# Patient Record
Sex: Male | Born: 1960 | Race: Black or African American | Hispanic: No | Marital: Single | Smoking: Former smoker
Health system: Southern US, Community
[De-identification: ages and names within clinical notes are randomized; demographics above are authoritative.]

## PROBLEM LIST (undated history)

## (undated) DIAGNOSIS — Z8249 Family history of ischemic heart disease and other diseases of the circulatory system: Secondary | ICD-10-CM

## (undated) DIAGNOSIS — I2510 Atherosclerotic heart disease of native coronary artery without angina pectoris: Secondary | ICD-10-CM

## (undated) DIAGNOSIS — R0789 Other chest pain: Secondary | ICD-10-CM

## (undated) DIAGNOSIS — I10 Essential (primary) hypertension: Secondary | ICD-10-CM

## (undated) DIAGNOSIS — E785 Hyperlipidemia, unspecified: Secondary | ICD-10-CM

## (undated) HISTORY — DX: Other chest pain: R07.89

## (undated) HISTORY — PX: TONSILLECTOMY: SUR1361

## (undated) HISTORY — DX: Atherosclerotic heart disease of native coronary artery without angina pectoris: I25.10

## (undated) HISTORY — DX: Family history of ischemic heart disease and other diseases of the circulatory system: Z82.49

## (undated) HISTORY — PX: CORONARY ANGIOPLASTY WITH STENT PLACEMENT: SHX49

## (undated) HISTORY — DX: Essential (primary) hypertension: I10

## (undated) HISTORY — PX: PERIPHERAL VASCULAR INTERVENTION: CATH118257

---

## 2017-06-02 ENCOUNTER — Emergency Department (HOSPITAL_COMMUNITY)
Admission: EM | Admit: 2017-06-02 | Discharge: 2017-06-02 | Disposition: A | Discharge status: Home / Self Care | Payer: 59 | Attending: Emergency Medicine | Admitting: Emergency Medicine

## 2017-06-02 ENCOUNTER — Encounter (HOSPITAL_COMMUNITY): Payer: Self-pay | Admitting: Emergency Medicine

## 2017-06-02 ENCOUNTER — Emergency Department (HOSPITAL_COMMUNITY): Payer: 59

## 2017-06-02 DIAGNOSIS — I10 Essential (primary) hypertension: Secondary | ICD-10-CM | POA: Diagnosis not present

## 2017-06-02 DIAGNOSIS — Z7982 Long term (current) use of aspirin: Secondary | ICD-10-CM | POA: Diagnosis not present

## 2017-06-02 DIAGNOSIS — R072 Precordial pain: Secondary | ICD-10-CM

## 2017-06-02 DIAGNOSIS — Z87891 Personal history of nicotine dependence: Secondary | ICD-10-CM | POA: Diagnosis not present

## 2017-06-02 DIAGNOSIS — Z79899 Other long term (current) drug therapy: Secondary | ICD-10-CM | POA: Insufficient documentation

## 2017-06-02 DIAGNOSIS — Z91040 Latex allergy status: Secondary | ICD-10-CM | POA: Diagnosis not present

## 2017-06-02 DIAGNOSIS — R079 Chest pain, unspecified: Secondary | ICD-10-CM

## 2017-06-02 HISTORY — DX: Hyperlipidemia, unspecified: E78.5

## 2017-06-02 HISTORY — DX: Essential (primary) hypertension: I10

## 2017-06-02 LAB — BASIC METABOLIC PANEL
Anion gap: 11 (ref 5–15)
BUN: 22 mg/dL — AB (ref 6–20)
CHLORIDE: 98 mmol/L — AB (ref 101–111)
CO2: 27 mmol/L (ref 22–32)
Calcium: 9.1 mg/dL (ref 8.9–10.3)
Creatinine, Ser: 1.55 mg/dL — ABNORMAL HIGH (ref 0.61–1.24)
GFR calc Af Amer: 55 mL/min — ABNORMAL LOW (ref >60)
GFR calc non Af Amer: 48 mL/min — ABNORMAL LOW (ref >60)
Glucose, Bld: 125 mg/dL — ABNORMAL HIGH (ref 65–99)
POTASSIUM: 2.9 mmol/L — AB (ref 3.5–5.1)
SODIUM: 136 mmol/L (ref 135–145)

## 2017-06-02 LAB — CBC
HEMATOCRIT: 40.2 % (ref 39.0–52.0)
Hemoglobin: 13.3 g/dL (ref 13.0–17.0)
MCH: 26.9 pg (ref 26.0–34.0)
MCHC: 33.1 g/dL (ref 30.0–36.0)
MCV: 81.4 fL (ref 78.0–100.0)
PLATELETS: 231 10*3/uL (ref 150–400)
RBC: 4.94 MIL/uL (ref 4.22–5.81)
RDW: 14.2 % (ref 11.5–15.5)
WBC: 9.7 10*3/uL (ref 4.0–10.5)

## 2017-06-02 LAB — I-STAT TROPONIN, ED
TROPONIN I, POC: 0 ng/mL (ref 0.00–0.08)
Troponin i, poc: 0 ng/mL (ref 0.00–0.08)

## 2017-06-02 MED ORDER — ASPIRIN 81 MG PO CHEW
324.0000 mg | CHEWABLE_TABLET | Freq: Once | ORAL | Status: AC
  Administered 2017-06-02: 324 mg via ORAL
  Filled 2017-06-02: qty 4

## 2017-06-02 MED ORDER — POTASSIUM CHLORIDE CRYS ER 20 MEQ PO TBCR
60.0000 meq | EXTENDED_RELEASE_TABLET | Freq: Once | ORAL | Status: AC
  Administered 2017-06-02: 60 meq via ORAL
  Filled 2017-06-02: qty 3

## 2017-06-02 NOTE — ED Provider Notes (Signed)
[facility]-EMERGENCY DEPT Provider Note   CSN: [id number] Arrival date & time: 06/02/17  1827     History   Chief Complaint Chief Complaint  Patient presents with  . Chest Pain    HPI Shaun [name] is a 56 y.o. male.  HPI Pt comes in with cc of chest pain. Pain started 2 days ago, is intermittent and lasts for few minutes. Pt has no specific evoking factor. Pain is described as dull pain, and is non radiating. Pain is worse with deep inspiration and improves with walking. Pt also feels like he has indigestion. Pt has no associated nausea, dib, sweating.  Pt has family hx of CAD. Pt is not a smoker, and he denies substance abuse.  Pt had a stress test several years ago, which was neg,  Past Medical History:  Diagnosis Date  . Hyperlipidemia with target low density lipoprotein (LDL) cholesterol less than 100 mg/dL   . Hypertension     There are no active problems to display for this patient.   History reviewed. No pertinent surgical history.     Home Medications    Prior to Admission medications   Medication Sig Start Date End Date Taking? Authorizing Provider  aspirin EC 81 MG tablet Take 81 mg by mouth daily.   Yes [provider]  cilostazol (PLETAL) 100 MG tablet Take 100 mg by mouth 2 (two) times daily. 01/08/17 01/08/18 Yes [provider]  enalapril-hydrochlorothiazide (VASERETIC) 10-25 MG tablet Take 1 tablet by mouth daily. 01/08/17  Yes [provider]  metoprolol succinate (TOPROL-XL) 50 MG 24 hr tablet Take 50 mg by mouth daily.   Yes [provider]  Multiple Vitamin (MULTI-VITAMINS) TABS Take 1 tablet by mouth daily.   Yes [provider]  NIFEdipine (PROCARDIA-XL/ADALAT CC) 60 MG 24 hr tablet Take 60 mg by mouth daily.   Yes [provider]  potassium chloride (K-DUR) 10 MEQ tablet Take 20 mEq by mouth daily.   Yes [provider]  simvastatin (ZOCOR) 20 MG tablet Take 20 mg by mouth daily.   Yes  [provider]  timolol (TIMOPTIC) 0.5 % ophthalmic solution Place 1 drop into both eyes 2 (two) times daily. 04/03/17  Yes [provider]    Family History No family history on file.  Social History Social History  Substance Use Topics  . Smoking status: Former Games developer  . Smokeless tobacco: Former Neurosurgeon  . Alcohol use Yes     Allergies   Latex   Review of Systems Review of Systems  Constitutional: Positive for activity change.  Respiratory: Positive for chest tightness.   Cardiovascular: Positive for chest pain.  Gastrointestinal: Positive for abdominal pain. Negative for nausea.  Neurological: Negative for light-headedness.  Hematological: Does not bruise/bleed easily.  All other systems reviewed and are negative.    Physical Exam Updated Vital Signs BP (!) 151/90   Pulse 93   Temp 98.9 F (37.2 C) (Oral)   Resp (!) 23   Ht  (1.803 m)   Wt 93 kg (205 lb)   SpO2 99%   BMI 28.59 kg/m   Physical Exam  Constitutional: He is oriented to person, place, and time. He appears well-developed.  HENT:  Head: Atraumatic.  Neck: Neck supple.  Cardiovascular: Normal rate.   Pulmonary/Chest: Effort normal.  Neurological: He is alert and oriented to person, place, and time.  Skin: Skin is warm.  Nursing note and vitals reviewed.    ED Treatments / Results  Labs (all labs ordered are listed, but only abnormal results are displayed) Labs Reviewed  BASIC METABOLIC PANEL - Abnormal; Notable for the following:       Result Value   Potassium 2.9 (*)    Chloride 98 (*)    Glucose, Bld 125 (*)    BUN 22 (*)    Creatinine, Ser 1.55 (*)    GFR calc non Af Amer 48 (*)    GFR calc Af Amer 55 (*)    All other components within normal limits  CBC  I-STAT TROPONIN, ED  I-STAT TROPONIN, ED    EKG  EKG Interpretation  Date/Time:  Saturday June 02 2017 18:33:39 EDT Ventricular Rate:  123 PR Interval:  148 QRS Duration: 84 QT  Interval:  318 QTC Calculation: 455 R Axis:   56 Text Interpretation:  Sinus tachycardia Otherwise normal ECG No acute changes No significant change since last tracing Confirmed by Derwood Kaplan [phone]) on 06/02/2017 8:43:09 PM       Radiology Dg Chest 2 View  Result Date: 06/02/2017 CLINICAL DATA:  Left-sided chest pain for 3 days. EXAM: CHEST  2 VIEW COMPARISON:  None FINDINGS: The heart size and mediastinal contours are within normal limits. Left lung base atelectasis identified. The visualized skeletal structures are unremarkable. IMPRESSION: 1. Left base atelectasis. Electronically Signed   By: Signa Kell M.D.   On: 06/02/2017 19:26    Procedures Procedures (including critical care time)  Medications Ordered in ED Medications  aspirin chewable tablet 324 mg (324 mg Oral Given 06/02/17 2220)  potassium chloride SA (K-DUR,KLOR-CON) CR tablet 60 mEq (60 mEq Oral Given 06/02/17 2220)     Initial Impression / Assessment and Plan / ED Course  I have reviewed the triage vital signs and the nursing notes.  Pertinent labs & imaging results that were available during my care of the patient were reviewed by me and considered in my medical decision making (see chart for details).  Clinical Course as of Jun 03 2  Sat Jun 02, 2017  2256 Results from the ER workup discussed with the patient face to face and all questions answered to the best of my ability.  Pt informed that his HEART score is 4, therefore there is a MACE possibility of 10% over the next month. We recommended admission to the hospital, but pt preferred an outpatient approach. We discussed the risk and benefits of going home, including possible morbidity and mortality risk, and pt comfortable going home. He will return to the ER if there is worsening chest pain, shortness of breath, pain radiating to your jaw, shoulder, or back, sweats or fainting.  Troponin i, poc: 0.00 [AN]    Clinical Course User Index [AN]  Derwood Kaplan, MD   Differential diagnosis includes: ACS syndrome Valvular disorder Myocarditis Pericarditis Endocarditis PE Pneumothorax Musculoskeletal pain PUD / Gastritis / Esophagitis Esophageal spasm  Pt comes in with cc of chest pain. Chest pain is not typical for ACS, it is intermittent, unprovoked, better with ambulation. Pain is L sided however and pt has hx of PVD, HTN, HL and even premature CAD in the family. EKG is reassuring. We will reassess.  Final Clinical Impressions(s) / ED Diagnoses   Final diagnoses:  Precordial pain  Nonspecific chest pain    New Prescriptions New Prescriptions   No medications on file     Derwood Kaplan, MD 06/03/17 0004

## 2017-06-02 NOTE — ED Notes (Signed)
Pt understood dc material. NAD noted. 

## 2017-06-02 NOTE — Discharge Instructions (Signed)
We saw you in the ER for the chest pain. All of our cardiac workup is normal, including labs, EKG and chest X-RAY are normal. We are not sure what is causing your discomfort, we are still concerned for possibly heart being the cause. The workup in the ER is not complete, and you should follow up with the Cardiologist for further evaluation.  Please return to the ER if you have worsening chest pain, shortness of breath, pain radiating to your jaw, shoulder, or back, sweats or fainting. Otherwise see the Cardiologist as requested.

## 2017-06-02 NOTE — ED Triage Notes (Signed)
Pt. Stated, I've had chest pain since Thursday and its not gone away. Denies any other symptoms

## 2017-06-05 ENCOUNTER — Encounter: Payer: Self-pay | Admitting: Cardiovascular Disease

## 2017-06-05 ENCOUNTER — Ambulatory Visit (INDEPENDENT_AMBULATORY_CARE_PROVIDER_SITE_OTHER): Payer: Managed Care, Other (non HMO) | Admitting: Cardiovascular Disease

## 2017-06-05 ENCOUNTER — Telehealth: Payer: Self-pay | Admitting: *Deleted

## 2017-06-05 VITALS — BP 134/80 | HR 98 | Ht 71.0 in | Wt 213.6 lb

## 2017-06-05 DIAGNOSIS — Z8249 Family history of ischemic heart disease and other diseases of the circulatory system: Secondary | ICD-10-CM | POA: Diagnosis not present

## 2017-06-05 DIAGNOSIS — I10 Essential (primary) hypertension: Secondary | ICD-10-CM

## 2017-06-05 DIAGNOSIS — E7800 Pure hypercholesterolemia, unspecified: Secondary | ICD-10-CM | POA: Diagnosis not present

## 2017-06-05 DIAGNOSIS — R079 Chest pain, unspecified: Secondary | ICD-10-CM | POA: Diagnosis not present

## 2017-06-05 DIAGNOSIS — Z5181 Encounter for therapeutic drug level monitoring: Secondary | ICD-10-CM

## 2017-06-05 DIAGNOSIS — E785 Hyperlipidemia, unspecified: Secondary | ICD-10-CM | POA: Insufficient documentation

## 2017-06-05 DIAGNOSIS — R0789 Other chest pain: Secondary | ICD-10-CM

## 2017-06-05 DIAGNOSIS — E876 Hypokalemia: Secondary | ICD-10-CM

## 2017-06-05 HISTORY — DX: Essential (primary) hypertension: I10

## 2017-06-05 HISTORY — DX: Other chest pain: R07.89

## 2017-06-05 HISTORY — DX: Family history of ischemic heart disease and other diseases of the circulatory system: Z82.49

## 2017-06-05 NOTE — Progress Notes (Signed)
Cardiology Office Note   Date:  06/05/2017   ID:  Shaun [name], DOB [birth year], MRN [id number]  PCP:  Frederico Hamman, MD  Cardiologist:   Chilton Si, MD   Chief Complaint  Patient presents with  . New Patient (Initial Visit)      History of Present Illness: Shaun [name] is a 57 y.o. male with hypertension and hyperlipidemia who presents for an evaluation of chest pain.  He was seen in the ED 9/29 with two days of pleuritic chest pain that improved with walking.  He also reported indigestion.  EKG showed sinus tachycardia at 123 bpm and no ischemia.  Cardiac enzymes were negative x2.  He reports that prior to going to the ED he had 3 days of constant, left-sided chest pain.  The symptoms started at rest and improved with walking.  There was no associated shortness of breath, nausea or diaphoresis.  The Pain ranges from 1-4 in severity. The constant, dull discomfort. Two days ago he had a good bowel movement movement and the symptoms subsided.  He has not experienced any exertional symptoms.  He also notes that it is better after drinking alcohol.  He doesn't get much formal exercise.  [name] died of a heart attack at age 67.  [name] snoring and daytime somnolence but denies apnea or feeling tired upon awakening.  He sometimes checks his BP at home and it is typically 120/80.  He doesn't add salt to his food but eats out every day.  He quit smoking over 30 years ago.  He notes that his heart rate is chronically high but he denies palpitations.    Past Medical History:  Diagnosis Date  . Atypical chest pain 06/05/2017  . Essential hypertension 06/05/2017  . Family history of premature CAD 06/05/2017  . Hyperlipidemia with target low density lipoprotein (LDL) cholesterol less than 100 mg/dL   . Hypertension     No past surgical history on file.   Current Outpatient Prescriptions  Medication Sig Dispense Refill  . aspirin EC 81 MG tablet Take 81  mg by mouth daily.    . cilostazol (PLETAL) 100 MG tablet Take 100 mg by mouth 2 (two) times daily.    . enalapril-hydrochlorothiazide (VASERETIC) 10-25 MG tablet Take 1 tablet by mouth daily.    . metoprolol succinate (TOPROL-XL) 50 MG 24 hr tablet Take 50 mg by mouth daily.    . Multiple Vitamin (MULTI-VITAMINS) TABS Take 1 tablet by mouth daily.    Marland Kitchen NIFEdipine (PROCARDIA-XL/ADALAT CC) 60 MG 24 hr tablet Take 60 mg by mouth daily.    . potassium chloride (K-DUR) 10 MEQ tablet Take 20 mEq by mouth daily.    . simvastatin (ZOCOR) 20 MG tablet Take 20 mg by mouth daily.    . timolol (TIMOPTIC) 0.5 % ophthalmic solution Place 1 drop into both eyes 2 (two) times daily.  0   No current facility-administered medications for this visit.     Allergies:   Latex    Social History:  The patient  reports that he has quit smoking. He has quit using smokeless tobacco. He reports that he drinks alcohol. He reports that he does not use drugs.   Family History:  The patient's family history includes CAD in his father; Heart attack in his father, paternal grandmother, and paternal uncle; Stroke in his brother.    ROS:  Please see the history of present illness.   Otherwise, review of systems  are positive for none.   All other systems are reviewed and negative.    PHYSICAL EXAM: VS:  BP 134/80   Pulse 98   Ht  (1.803 m)   Wt 96.9 kg (213 lb 9.6 oz)   BMI 29.79 kg/m  , BMI Body mass index is 29.79 kg/m. GENERAL:  Well appearing HEENT:  Pupils equal round and reactive, fundi not visualized, oral mucosa unremarkable NECK:  No jugular venous distention, waveform within normal limits, carotid upstroke brisk and symmetric, no bruits, no thyromegaly LUNGS:  Clear to auscultation bilaterally HEART:  RRR.  PMI not displaced or sustained,S1 and S2 within normal limits, no S3, no S4, no clicks, no rubs, no murmurs ABD:  Flat, positive bowel sounds normal in frequency in pitch, no bruits, no rebound,  no guarding, no midline pulsatile mass, no hepatomegaly, no splenomegaly EXT:  2 plus pulses throughout, no edema, no cyanosis no clubbing SKIN:  No rashes no nodules NEURO:  Cranial nerves II through XII grossly intact, motor grossly intact throughout PSYCH:  Cognitively intact, oriented to person place and time    EKG:  EKG is not ordered today. 06/02/17: Sinus tachycardia.  Rate 123 bpm.   Recent Labs: 06/02/2017: BUN 22; Creatinine, Ser 1.55; Hemoglobin 13.3; Platelets 231; Potassium 2.9; Sodium 136   01/02/17: Total cholesterol 212, triglycerides 150, HDL 52, LDL 129   Lipid Panel No results found for: CHOL, TRIG, HDL, CHOLHDL, VLDL, LDLCALC, LDLDIRECT    Wt Readings from Last 3 Encounters:  06/05/17 96.9 kg (213 lb 9.6 oz)  06/02/17 93 kg (205 lb)      ASSESSMENT AND PLAN:  # Atypical chest pain: # Family history of premature CAD: # Hyperlipidemia: Symptoms Are unlikely to represent ischemia. However, given his father's history of premature CAD, we will get a coronary CT-A to better evaluate.  Continue aspirin, metoprolol, and simvastatin. His statin may need to be changed based on the findings of his coronary CT-A.  # Hypertension: Blood pressure is above goal today. However, he reports that it is well-controlled at home. I have asked him to log his blood pressures and bring to follow-up. Continue enalapril, hydrochlorothiazide, metoprolol, and nifedipine.    Current medicines are reviewed at length with the patient today.  The patient does not have concerns regarding medicines.  The following changes have been made:  no change  Labs/ tests ordered today include:   Orders Placed This Encounter  Procedures  . CT CORONARY MORPH W/CTA COR W/SCORE W/CA W/CM &/OR WO/CM  . CT CORONARY FRACTIONAL FLOW RESERVE DATA PREP  . CT CORONARY FRACTIONAL FLOW RESERVE FLUID ANALYSIS     Disposition:   FU with Daviyon Widmayer C. Duke Salvia, MD, [facility] in 6 weeks.    This note was written  with the assistance of speech recognition software.  Please excuse any transcriptional errors.  Signed, Avamarie Crossley C. Duke Salvia, MD, [facility]  06/05/2017 12:52 PM    [facility]

## 2017-06-05 NOTE — Patient Instructions (Signed)
Medication Instructions:  Your physician recommends that you continue on your current medications as directed. Please refer to the Current Medication list given to you today.  Labwork: none  Testing/Procedures: CORONARY (CARDIAC)  CTA   Follow-Up: Your physician recommends that you schedule a follow-up appointment in: 6 WEEKS   Any Other Special Instructions Will Be Listed Below (If Applicable). THE MORNING OF YOUR CARDIAC CTA TAKE METOPROLOL 100 MG   MONITOR YOUR BLOOD PRESSURE 3 TIMES A WEEK AND BRING TO YOUR FOLLOW UP     Cardiac CT Angiogram A cardiac CT angiogram is a procedure to look at the heart and the area around the heart. It may be done to help find the cause of chest pains or other symptoms of heart disease. During this procedure, a large X-ray machine, called a CT scanner, takes detailed pictures of the heart and the surrounding area after a dye (contrast material) has been injected into blood vessels in the area. The procedure is also sometimes called a coronary CT angiogram, coronary artery scanning, or CTA. A cardiac CT angiogram allows the health care provider to see how well blood is flowing to and from the heart. The health care provider will be able to see if there are any problems, such as:  Blockage or narrowing of the coronary arteries in the heart.  Fluid around the heart.  Signs of weakness or disease in the muscles, valves, and tissues of the heart.  Tell a health care provider about:  Any allergies you have. This is especially important if you have had a previous allergic reaction to contrast dye.  All medicines you are taking, including vitamins, herbs, eye drops, creams, and over-the-counter medicines.  Any blood disorders you have.  Any surgeries you have had.  Any medical conditions you have.  Whether you are pregnant or may be pregnant.  Any anxiety disorders, chronic pain, or other conditions you have that may increase your stress or prevent  you from lying still. What are the risks? Generally, this is a safe procedure. However, problems may occur, including:  Bleeding.  Infection.  Allergic reactions to medicines or dyes.  Damage to other structures or organs.  Kidney damage from the dye or contrast that is used.  Increased risk of cancer from radiation exposure. This risk is low. Talk with your health care provider about: ? The risks and benefits of testing. ? How you can receive the lowest dose of radiation.  What happens before the procedure?  Wear comfortable clothing and remove any jewelry, glasses, dentures, and hearing aids.  Follow instructions from your health care provider about eating and drinking. This may include: ? For 12 hours before the test - avoid caffeine. This includes tea, coffee, soda, energy drinks, and diet pills. Drink plenty of water or other fluids that do not have caffeine in them. Being well-hydrated can prevent complications. ? For 4-6 hours before the test - stop eating and drinking. The contrast dye can cause nausea, but this is less likely if your stomach is empty.  Ask your health care provider about changing or stopping your regular medicines. This is especially important if you are taking diabetes medicines, blood thinners, or medicines to treat erectile dysfunction. What happens during the procedure?  Hair on your chest may need to be removed so that small sticky patches called electrodes can be placed on your chest. These will transmit information that helps to monitor your heart during the test.  An IV tube will be  inserted into one of your veins.  You might be given a medicine to control your heart rate during the test. This will help to ensure that good images are obtained.  You will be asked to lie on an exam table. This table will slide in and out of the CT machine during the procedure.  Contrast dye will be injected into the IV tube. You might feel warm, or you may get a  metallic taste in your mouth.  You will be given a medicine (nitroglycerin) to relax (dilate) the arteries in your heart.  The table that you are lying on will move into the CT machine tunnel for the scan.  The person running the machine will give you instructions while the scans are being done. You may be asked to: ? Keep your arms above your head. ? Hold your breath. ? Stay very still, even if the table is moving.  When the scanning is complete, you will be moved out of the machine.  The IV tube will be removed. The procedure may vary among health care providers and hospitals. What happens after the procedure?  You might feel warm, or you may get a metallic taste in your mouth from the contrast dye.  You may have a headache from the nitroglycerin.  After the procedure, drink water or other fluids to wash (flush) the contrast material out of your body.  Contact a health care provider if you have any symptoms of allergy to the contrast. These symptoms include: ? Shortness of breath. ? Rash or hives. ? A racing heartbeat.  Most people can return to their normal activities right after the procedure. Ask your health care provider what activities are safe for you.  It is up to you to get the results of your procedure. Ask your health care provider, or the department that is doing the procedure, when your results will be ready. Summary  A cardiac CT angiogram is a procedure to look at the heart and the area around the heart. It may be done to help find the cause of chest pains or other symptoms of heart disease.  During this procedure, a large X-ray machine, called a CT scanner, takes detailed pictures of the heart and the surrounding area after a dye (contrast material) has been injected into blood vessels in the area.  Ask your health care provider about changing or stopping your regular medicines before the procedure. This is especially important if you are taking diabetes  medicines, blood thinners, or medicines to treat erectile dysfunction.  After the procedure, drink water or other fluids to wash (flush) the contrast material out of your body. This information is not intended to replace advice given to you by your health care provider. Make sure you discuss any questions you have with your health care provider. Document Released: 08/03/2008 Document Revised: 07/10/2016 Document Reviewed: 07/10/2016 Elsevier Interactive Patient Education  2017 [employer].

## 2017-06-05 NOTE — Telephone Encounter (Signed)
Patient seen in office today for consult. Dr Duke Salvia reviewed chart further after patient left and potassium was 2.9 at recent ED visit. Will increase K+ from 20 meq daily to 40 meq daily and repeat bmet end of week. Advised patient, verbalized understanding.

## 2017-06-08 LAB — BASIC METABOLIC PANEL
BUN/Creatinine Ratio: 11 (ref 9–20)
BUN: 16 mg/dL (ref 6–24)
CALCIUM: 9.2 mg/dL (ref 8.7–10.2)
CHLORIDE: 100 mmol/L (ref 96–106)
CO2: 24 mmol/L (ref 20–29)
CREATININE: 1.41 mg/dL — AB (ref 0.76–1.27)
GFR calc Af Amer: 63 mL/min/{1.73_m2} (ref >59)
GFR calc non Af Amer: 55 mL/min/{1.73_m2} — ABNORMAL LOW (ref >59)
GLUCOSE: 106 mg/dL — AB (ref 65–99)
Potassium: 3.7 mmol/L (ref 3.5–5.2)
SODIUM: 139 mmol/L (ref 134–144)

## 2017-06-11 ENCOUNTER — Telehealth: Payer: Self-pay | Admitting: *Deleted

## 2017-06-11 NOTE — Telephone Encounter (Signed)
-----   Message from Chilton Si, MD sent at 06/10/2017  1:32 PM EDT ----- Potassium is much better.  Kidney function is slightly better.

## 2017-06-11 NOTE — Telephone Encounter (Signed)
Advised patient of lab results Patient stated he did receive a call from [facility] and Coronary CT scan approved PA # [id number] Advised patient would let scheduling know approved and he should hear from them soon.

## 2017-06-20 ENCOUNTER — Encounter: Payer: Self-pay | Admitting: Cardiovascular Disease

## 2017-07-10 ENCOUNTER — Ambulatory Visit (HOSPITAL_COMMUNITY): Admission: RE | Admit: 2017-07-10 | Payer: Managed Care, Other (non HMO) | Source: Ambulatory Visit

## 2017-07-10 ENCOUNTER — Ambulatory Visit (HOSPITAL_COMMUNITY)
Admission: RE | Admit: 2017-07-10 | Discharge: 2017-07-10 | Disposition: A | Discharge status: Home / Self Care | Payer: Managed Care, Other (non HMO) | Source: Ambulatory Visit | Attending: Cardiovascular Disease | Admitting: Cardiovascular Disease

## 2017-07-10 DIAGNOSIS — I2510 Atherosclerotic heart disease of native coronary artery without angina pectoris: Secondary | ICD-10-CM | POA: Diagnosis not present

## 2017-07-10 DIAGNOSIS — R079 Chest pain, unspecified: Secondary | ICD-10-CM | POA: Diagnosis not present

## 2017-07-10 DIAGNOSIS — Z8249 Family history of ischemic heart disease and other diseases of the circulatory system: Secondary | ICD-10-CM | POA: Insufficient documentation

## 2017-07-10 MED ORDER — METOPROLOL TARTRATE 5 MG/5ML IV SOLN
INTRAVENOUS | Status: AC
  Administered 2017-07-10: 5 mg via INTRAVENOUS
  Filled 2017-07-10: qty 5

## 2017-07-10 MED ORDER — METOPROLOL TARTRATE 5 MG/5ML IV SOLN
5.0000 mg | INTRAVENOUS | Status: DC | PRN
  Administered 2017-07-10 (×5): 5 mg via INTRAVENOUS
  Filled 2017-07-10: qty 5

## 2017-07-10 MED ORDER — NITROGLYCERIN 0.4 MG SL SUBL
0.4000 mg | SUBLINGUAL_TABLET | SUBLINGUAL | Status: DC | PRN
  Administered 2017-07-10: 0.4 mg via SUBLINGUAL

## 2017-07-10 MED ORDER — IOPAMIDOL (ISOVUE-370) INJECTION 76%
INTRAVENOUS | Status: AC
  Administered 2017-07-10: 80 mL via INTRAVENOUS
  Filled 2017-07-10: qty 100

## 2017-07-10 MED ORDER — NITROGLYCERIN 0.4 MG SL SUBL
SUBLINGUAL_TABLET | SUBLINGUAL | Status: AC
  Administered 2017-07-10: 0.4 mg via SUBLINGUAL
  Filled 2017-07-10: qty 1

## 2017-07-10 MED ORDER — METOPROLOL TARTRATE 5 MG/5ML IV SOLN
INTRAVENOUS | Status: AC
  Filled 2017-07-10: qty 5

## 2017-07-10 MED ORDER — METOPROLOL TARTRATE 5 MG/5ML IV SOLN
INTRAVENOUS | Status: AC
  Administered 2017-07-10: 5 mg via INTRAVENOUS
  Filled 2017-07-10: qty 15

## 2017-07-11 ENCOUNTER — Other Ambulatory Visit: Payer: Self-pay | Admitting: Cardiology

## 2017-07-11 ENCOUNTER — Ambulatory Visit (HOSPITAL_COMMUNITY)
Admission: RE | Admit: 2017-07-11 | Discharge: 2017-07-11 | Disposition: A | Discharge status: Home / Self Care | Payer: Managed Care, Other (non HMO) | Source: Ambulatory Visit | Attending: Cardiovascular Disease | Admitting: Cardiovascular Disease

## 2017-07-11 DIAGNOSIS — Z8249 Family history of ischemic heart disease and other diseases of the circulatory system: Secondary | ICD-10-CM | POA: Diagnosis not present

## 2017-07-13 DIAGNOSIS — R079 Chest pain, unspecified: Secondary | ICD-10-CM | POA: Diagnosis not present

## 2017-07-19 ENCOUNTER — Telehealth: Payer: Self-pay | Admitting: *Deleted

## 2017-07-19 ENCOUNTER — Ambulatory Visit: Payer: Managed Care, Other (non HMO) | Admitting: Cardiovascular Disease

## 2017-07-19 ENCOUNTER — Encounter: Payer: Self-pay | Admitting: *Deleted

## 2017-07-19 ENCOUNTER — Encounter: Payer: Self-pay | Admitting: Cardiovascular Disease

## 2017-07-19 VITALS — BP 132/88 | HR 84 | Ht 71.0 in | Wt 214.4 lb

## 2017-07-19 DIAGNOSIS — Z9861 Coronary angioplasty status: Secondary | ICD-10-CM

## 2017-07-19 DIAGNOSIS — R0789 Other chest pain: Secondary | ICD-10-CM | POA: Diagnosis not present

## 2017-07-19 DIAGNOSIS — E7800 Pure hypercholesterolemia, unspecified: Secondary | ICD-10-CM | POA: Diagnosis not present

## 2017-07-19 DIAGNOSIS — Z8249 Family history of ischemic heart disease and other diseases of the circulatory system: Secondary | ICD-10-CM

## 2017-07-19 DIAGNOSIS — I2510 Atherosclerotic heart disease of native coronary artery without angina pectoris: Secondary | ICD-10-CM | POA: Diagnosis not present

## 2017-07-19 HISTORY — DX: Atherosclerotic heart disease of native coronary artery without angina pectoris: I25.10

## 2017-07-19 MED ORDER — ROSUVASTATIN CALCIUM 40 MG PO TABS: 40.0000 mg | ORAL_TABLET | Freq: Every day | ORAL | 3 refills | Status: DC

## 2017-07-19 MED ORDER — METOPROLOL SUCCINATE ER 100 MG PO TB24: 100.0000 mg | ORAL_TABLET | Freq: Every day | ORAL | 3 refills | Status: DC

## 2017-07-19 MED ORDER — POTASSIUM CHLORIDE ER 10 MEQ PO TBCR: 20.0000 meq | EXTENDED_RELEASE_TABLET | Freq: Two times a day (BID) | ORAL | 3 refills | Status: DC

## 2017-07-19 NOTE — Progress Notes (Signed)
Cardiology Office Note   Date:  07/19/2017   ID:  Shaun [name], DOB 08-Aug-1961, MRN [id number]  PCP:  Frederico HammanMiller, Carlton D, MD  Cardiologist:   Shaun Siiffany Inez, MD   No chief complaint on file.     History of Present Illness: Shaun [name] is a 56 y.o. male with CAD, hypertension and hyperlipidemia who presents for an evaluation of chest pain.  He was seen in the ED 9/29 with two days of pleuritic chest pain that improved with walking.  He also reported indigestion.  EKG showed sinus tachycardia at 123 bpm and no ischemia.  Cardiac enzymes were negative x2.  He reports that prior to going to the ED he had 3 days of constant, left-sided chest pain.  The symptoms started at rest and improved with walking.  There was no associated shortness of breath, nausea or diaphoresis.  He was seen in clinic 06/2017 and referred for a coronary CT-A that revealed 40-50% distal left main disease.  FFR of the mid LAD was 0.71 and the distal left circumflex was 0.75, both consistent with ischemia.  His coronary calcium score was in the 98th percentile.   Shaun [name]'s father died of a heart attack at age 58.  At his last appointment Shaun [name]'s blood pressure was noted to be elevated.  He was asked to check his blood pressure at home.  He kept a log of his BP but didn't bring it with him.  He thinks that most of his blood pressures have been pretty good.  He may have had 1 or 2 in the 140s.  He has been walking for exercise for 5 times per week.  He tends to walk for 25-30 minutes.  He has no exertional chest pain or shortness of breath.  He has not noted any lower extremity edema, orthopnea, or PND.  Past Medical History:  Diagnosis Date  . Atypical chest pain 06/05/2017  . CAD in native artery 07/19/2017  . Essential hypertension 06/05/2017  . Family history of premature CAD 06/05/2017  . Hyperlipidemia with target low density lipoprotein (LDL) cholesterol less than 100 mg/dL   . Hypertension     History  reviewed. No pertinent surgical history.   Current Outpatient Medications  Medication Sig Dispense Refill  . rosuvastatin (CRESTOR) 40 MG tablet Take 40 mg daily by mouth.    Marland Kitchen. aspirin EC 81 MG tablet Take 81 mg by mouth daily.    . cilostazol (PLETAL) 100 MG tablet Take 100 mg by mouth 2 (two) times daily.    . enalapril-hydrochlorothiazide (VASERETIC) 10-25 MG tablet Take 1 tablet by mouth daily.    . metoprolol succinate (TOPROL-XL) 50 MG 24 hr tablet Take 50 mg by mouth daily.    . Multiple Vitamin (MULTI-VITAMINS) TABS Take 1 tablet by mouth daily.    Marland Kitchen. NIFEdipine (PROCARDIA-XL/ADALAT CC) 60 MG 24 hr tablet Take 60 mg by mouth daily.    . potassium chloride (K-DUR) 10 MEQ tablet Take 20 mEq by mouth 2 (two) times daily.    . timolol (TIMOPTIC) 0.5 % ophthalmic solution Place 1 drop into both eyes 2 (two) times daily.  0   No current facility-administered medications for this visit.     Allergies:   Latex    Social History:  The patient  reports that he has quit smoking. He has quit using smokeless tobacco. He reports that he drinks alcohol. He reports that he does not use drugs.   Family History:  The  patient's family history includes CAD in his father; Heart attack in his father, paternal grandmother, and paternal uncle; Stroke in his brother.    ROS:  Please see the history of present illness.   Otherwise, review of systems are positive for none.   All other systems are reviewed and negative.    PHYSICAL EXAM: VS:  BP 132/88   Pulse 84   Ht 5\' 11"  (1.803 m)   Wt 97.3 kg (214 lb 6.4 oz)   BMI 29.90 kg/m  , BMI Body mass index is 29.9 kg/m. GENERAL:  Well appearing HEENT: Pupils equal round and reactive, fundi not visualized, oral mucosa unremarkable NECK:  No jugular venous distention, waveform within normal limits, carotid upstroke brisk and symmetric, no bruits, no thyromegaly LYMPHATICS:  No cervical adenopathy LUNGS:  Clear to auscultation bilaterally HEART:  RRR.   PMI not displaced or sustained,S1 and S2 within normal limits, no S3, no S4, no clicks, no rubs, no murmurs ABD:  Flat, positive bowel sounds normal in frequency in pitch, no bruits, no rebound, no guarding, no midline pulsatile mass, no hepatomegaly, no splenomegaly EXT:   1+ right radial pulse.  Absent left radial pulse no edema, no cyanosis no clubbing SKIN:  No rashes no nodules NEURO:  Cranial nerves II through XII grossly intact, motor grossly intact throughout PSYCH:  Cognitively intact, oriented to person place and time   EKG:  EKG is not ordered today. 06/02/17: Sinus tachycardia.  Rate 123 bpm.  Coronary CT-A 07/11/17: IMPRESSION: 1.  40-50% distal left main stenosis. 2.  Possible moderate mid LAD stenosis. 3. Coronary artery calcium score 628 Agatston units, placing the patient in the 98th percentile for age and gender. This suggests high risk for future cardiac events. FFR 0.71 in the mid LAD, suggesting a hemodynamically significant stenosis. FFR 0.75 in the distal LCx, suggesting a hemodynamically significant stenosis.  Recent Labs: 06/02/2017: Hemoglobin 13.3; Platelets 231 06/08/2017: BUN 16; Creatinine, Ser 1.41; Potassium 3.7; Sodium 139   01/02/17: Total cholesterol 212, triglycerides 150, HDL 52, LDL 129   Lipid Panel No results found for: CHOL, TRIG, HDL, CHOLHDL, VLDL, LDLCALC, LDLDIRECT    Wt Readings from Last 3 Encounters:  07/19/17 97.3 kg (214 lb 6.4 oz)  06/05/17 96.9 kg (213 lb 9.6 oz)  06/02/17 93 kg (205 lb)      ASSESSMENT AND PLAN:  # CAD:  # Atypical chest pain: # Family history of premature CAD: # Hyperlipidemia: Symptoms are very atypical and he hasn't experienced any chest pain lately.  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  Given the concern for LM, LAD and LCx obstruction and family history of premature CAD, we will get a left heart catheterization.  Prior to this we will get an  echocardiogram to assess his systolic function.  Given his CKD III he will need 1 L of IV fluids prior to that procedure.  LDL was 129.  We will switch simvastatin to rosuvastatin 40 mg.  Repeat lipids and CMP in 3 months.  Of note his radial pulses are diminished.  Risks and benefits of cardiac catheterization have been discussed with the patient.  The patient understands that risks included but are not limited to stroke (1 in 1000), death (1 in 1000), kidney failure [usually temporary] (1 in 500), bleeding (1 in 200), allergic reaction [possibly serious] (1 in 200). The patient understands and agrees to proceed.   # Hypertension: Blood  pressure is above goal today.  He forgot his BP log.  Likely increase metoprolol to 100mg  daily.  Continue enalapril, HCTZ, and nifedipine.    Current medicines are reviewed at length with the patient today.  The patient does not have concerns regarding medicines.  The following changes have been made:  no change  Labs/ tests ordered today include:   Orders Placed This Encounter  Procedures  . CBC with Differential/Platelet  . Basic metabolic panel  . INR/PT  . ECHOCARDIOGRAM COMPLETE     Disposition:   FU with Shatora Weatherbee C. Duke Salvia, MD, [facility] in 3 months    This note was written with the assistance of speech recognition software.  Please excuse any transcriptional errors.  Signed, Birdie Fetty C. Duke Salvia, MD, [facility]  07/19/2017 10:54 AM    [facility]

## 2017-07-19 NOTE — Patient Instructions (Addendum)
Medication Instructions:  STOP SIMVASTATIN   START ROSUVASTATIN 40 MG DAILY   Labwork: CMET/CBC/PT/INR WHEN YOU RETURN FOR ECHO   Testing/Procedures: Your physician has requested that you have an echocardiogram. Echocardiography is a painless test that uses sound waves to create images of your heart. It provides your doctor with information about the size and shape of your heart and how well your heart's chambers and valves are working. This procedure takes approximately one hour. There are no restrictions for this procedure. [facility] AT [address] WEEK OF 07/30/17  Your physician has requested that you have a cardiac catheterization. Cardiac catheterization is used to diagnose and/or treat various heart conditions. Doctors may recommend this procedure for a number of different reasons. The most common reason is to evaluate chest pain. Chest pain can be a symptom of coronary artery disease (CAD), and cardiac catheterization can show whether plaque is narrowing or blocking your heart's arteries. This procedure is also used to evaluate the valves, as well as measure the blood flow and oxygen levels in different parts of your heart. For further information please visit [email or web address]. Please follow instruction sheet, as given. WILL ARRANGE FOR 08/06/17 AND CALL YOU WITH A TIME  Follow-Up: Your physician recommends that you schedule a follow-up appointment in: 3 MONTH OV  If you need a refill on your cardiac medications before your next appointment, please call your pharmacy.

## 2017-07-19 NOTE — Telephone Encounter (Signed)
Patient brought in his blood pressure readings for Dr Duke Salviaandolph review. Several systolic blood pressure in the 140's. Dr Duke Salviaandolph recommended increasing Metoprolol to 100 mg daily. Advised patient, verbalized understanding

## 2017-07-19 NOTE — H&P (View-Only) (Signed)
Cardiology Office Note   Date:  07/19/2017   ID:  Shaun DowningDarryl [name], DOB 08-Aug-1961, MRN [id number]  PCP:  Shaun [name], Shaun [name], Shaun [name]  Cardiologist:   Shaun [name], Shaun [name]   No chief complaint on file.     History of Present Illness: Shaun [name] is a 56 y.o. male with CAD, hypertension and hyperlipidemia who presents for an evaluation of chest pain.  He was seen in the ED 9/29 with two days of pleuritic chest pain that improved with walking.  He also reported indigestion.  EKG showed sinus tachycardia at 123 bpm and no ischemia.  Cardiac enzymes were negative x2.  He reports that prior to going to the ED he had 3 days of constant, left-sided chest pain.  The symptoms started at rest and improved with walking.  There was no associated shortness of breath, nausea or diaphoresis.  He was seen in clinic 06/2017 and referred for a coronary CT-A that revealed 40-50% distal left main disease.  FFR of the mid LAD was 0.71 and the distal left circumflex was 0.75, both consistent with ischemia.  His coronary calcium score was in the 98th percentile.   Shaun [name]'s father died of a heart attack at age 58.  At his last appointment Shaun [name]'s blood pressure was noted to be elevated.  He was asked to check his blood pressure at home.  He kept a log of his BP but didn't bring it with him.  He thinks that most of his blood pressures have been pretty good.  He may have had 1 or 2 in the 140s.  He has been walking for exercise for 5 times per week.  He tends to walk for 25-30 minutes.  He has no exertional chest pain or shortness of breath.  He has not noted any lower extremity edema, orthopnea, or PND.  Past Medical History:  Diagnosis Date  . Atypical chest pain 06/05/2017  . CAD in native artery 07/19/2017  . Essential hypertension 06/05/2017  . Family history of premature CAD 06/05/2017  . Hyperlipidemia with target low density lipoprotein (LDL) cholesterol less than 100 mg/dL   . Hypertension     History  reviewed. No pertinent surgical history.   Current Outpatient Medications  Medication Sig Dispense Refill  . rosuvastatin (CRESTOR) 40 MG tablet Take 40 mg daily by mouth.    Marland Kitchen. aspirin EC 81 MG tablet Take 81 mg by mouth daily.    . cilostazol (PLETAL) 100 MG tablet Take 100 mg by mouth 2 (two) times daily.    . enalapril-hydrochlorothiazide (VASERETIC) 10-25 MG tablet Take 1 tablet by mouth daily.    . metoprolol succinate (TOPROL-XL) 50 MG 24 hr tablet Take 50 mg by mouth daily.    . Multiple Vitamin (MULTI-VITAMINS) TABS Take 1 tablet by mouth daily.    Marland Kitchen. NIFEdipine (PROCARDIA-XL/ADALAT CC) 60 MG 24 hr tablet Take 60 mg by mouth daily.    . potassium chloride (K-DUR) 10 MEQ tablet Take 20 mEq by mouth 2 (two) times daily.    . timolol (TIMOPTIC) 0.5 % ophthalmic solution Place 1 drop into both eyes 2 (two) times daily.  0   No current facility-administered medications for this visit.     Allergies:   Latex    Social History:  The patient  reports that he has quit smoking. He has quit using smokeless tobacco. He reports that he drinks alcohol. He reports that he does not use drugs.   Family History:  The  patient's family history includes CAD in his father; Heart attack in his father, paternal grandmother, and paternal uncle; Stroke in his brother.    ROS:  Please see the history of present illness.   Otherwise, review of systems are positive for none.   All other systems are reviewed and negative.    PHYSICAL EXAM: VS:  BP 132/88   Pulse 84   Ht 5\' 11"  (1.803 m)   Wt 97.3 kg (214 lb 6.4 oz)   BMI 29.90 kg/m  , BMI Body mass index is 29.9 kg/m. GENERAL:  Well appearing HEENT: Pupils equal round and reactive, fundi not visualized, oral mucosa unremarkable NECK:  No jugular venous distention, waveform within normal limits, carotid upstroke brisk and symmetric, no bruits, no thyromegaly LYMPHATICS:  No cervical adenopathy LUNGS:  Clear to auscultation bilaterally HEART:  RRR.   PMI not displaced or sustained,S1 and S2 within normal limits, no S3, no S4, no clicks, no rubs, no murmurs ABD:  Flat, positive bowel sounds normal in frequency in pitch, no bruits, no rebound, no guarding, no midline pulsatile mass, no hepatomegaly, no splenomegaly EXT:   1+ right radial pulse.  Absent left radial pulse no edema, no cyanosis no clubbing SKIN:  No rashes no nodules NEURO:  Cranial nerves II through XII grossly intact, motor grossly intact throughout PSYCH:  Cognitively intact, oriented to person place and time   EKG:  EKG is not ordered today. 06/02/17: Sinus tachycardia.  Rate 123 bpm.  Coronary CT-A 07/11/17: IMPRESSION: 1.  40-50% distal left main stenosis. 2.  Possible moderate mid LAD stenosis. 3. Coronary artery calcium score 628 Agatston units, placing the patient in the 98th percentile for age and gender. This suggests high risk for future cardiac events. FFR 0.71 in the mid LAD, suggesting a hemodynamically significant stenosis. FFR 0.75 in the distal LCx, suggesting a hemodynamically significant stenosis.  Recent Labs: 06/02/2017: Hemoglobin 13.3; Platelets 231 06/08/2017: BUN 16; Creatinine, Ser 1.41; Potassium 3.7; Sodium 139   01/02/17: Total cholesterol 212, triglycerides 150, HDL 52, LDL 129   Lipid Panel No results found for: CHOL, TRIG, HDL, CHOLHDL, VLDL, LDLCALC, LDLDIRECT    Wt Readings from Last 3 Encounters:  07/19/17 97.3 kg (214 lb 6.4 oz)  06/05/17 96.9 kg (213 lb 9.6 oz)  06/02/17 93 kg (205 lb)      ASSESSMENT AND PLAN:  # CAD:  # Atypical chest pain: # Family history of premature CAD: # Hyperlipidemia: Symptoms are very atypical and he hasn't experienced any chest pain lately.  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  Given the concern for LM, LAD and LCx obstruction and family history of premature CAD, we will get a left heart catheterization.  Prior to this we will get an  echocardiogram to assess his systolic function.  Given his CKD III he will need 1 L of IV fluids prior to that procedure.  LDL was 129.  We will switch simvastatin to rosuvastatin 40 mg.  Repeat lipids and CMP in 3 months.  Of note his radial pulses are diminished.  Risks and benefits of cardiac catheterization have been discussed with the patient.  The patient understands that risks included but are not limited to stroke (1 in 1000), death (1 in 1000), kidney failure [usually temporary] (1 in 500), bleeding (1 in 200), allergic reaction [possibly serious] (1 in 200). The patient understands and agrees to proceed.   # Hypertension: Blood  pressure is above goal today.  He forgot his BP log.  Likely increase metoprolol to 100mg  daily.  Continue enalapril, HCTZ, and nifedipine.    Current medicines are reviewed at length with the patient today.  The patient does not have concerns regarding medicines.  The following changes have been made:  no change  Labs/ tests ordered today include:   Orders Placed This Encounter  Procedures  . CBC with Differential/Platelet  . Basic metabolic panel  . INR/PT  . ECHOCARDIOGRAM COMPLETE     Disposition:   FU with Shaun Tellefsen C. Duke Salvia, Shaun [name], [facility] in 3 months    This note was written with the assistance of speech recognition software.  Please excuse any transcriptional errors.  Signed, Shaun Nembhard C. Duke Salvia, Shaun [name], [facility]  07/19/2017 10:54 AM    [facility]

## 2017-07-19 NOTE — Addendum Note (Signed)
Addended by: Regis BillPRATT, Ermagene Saidi B on: 07/19/2017 12:10 PM   Modules accepted: Orders

## 2017-07-31 ENCOUNTER — Other Ambulatory Visit: Payer: Self-pay

## 2017-07-31 ENCOUNTER — Ambulatory Visit (HOSPITAL_COMMUNITY): Payer: Managed Care, Other (non HMO) | Attending: Cardiology

## 2017-07-31 ENCOUNTER — Other Ambulatory Visit: Payer: Managed Care, Other (non HMO)

## 2017-07-31 DIAGNOSIS — Z87891 Personal history of nicotine dependence: Secondary | ICD-10-CM | POA: Diagnosis not present

## 2017-07-31 DIAGNOSIS — R0789 Other chest pain: Secondary | ICD-10-CM

## 2017-07-31 DIAGNOSIS — Z8249 Family history of ischemic heart disease and other diseases of the circulatory system: Secondary | ICD-10-CM | POA: Diagnosis not present

## 2017-07-31 DIAGNOSIS — E785 Hyperlipidemia, unspecified: Secondary | ICD-10-CM | POA: Diagnosis not present

## 2017-07-31 DIAGNOSIS — I2510 Atherosclerotic heart disease of native coronary artery without angina pectoris: Secondary | ICD-10-CM | POA: Diagnosis not present

## 2017-07-31 DIAGNOSIS — I119 Hypertensive heart disease without heart failure: Secondary | ICD-10-CM | POA: Diagnosis not present

## 2017-07-31 LAB — BASIC METABOLIC PANEL
BUN/Creatinine Ratio: 12 (ref 9–20)
BUN: 16 mg/dL (ref 6–24)
CALCIUM: 9.6 mg/dL (ref 8.7–10.2)
CHLORIDE: 99 mmol/L (ref 96–106)
CO2: 27 mmol/L (ref 20–29)
Creatinine, Ser: 1.32 mg/dL — ABNORMAL HIGH (ref 0.76–1.27)
GFR calc non Af Amer: 59 mL/min/{1.73_m2} — ABNORMAL LOW (ref >59)
GFR, EST AFRICAN AMERICAN: 68 mL/min/{1.73_m2} (ref >59)
GLUCOSE: 111 mg/dL — AB (ref 65–99)
POTASSIUM: 3.5 mmol/L (ref 3.5–5.2)
Sodium: 140 mmol/L (ref 134–144)

## 2017-07-31 LAB — CBC WITH DIFFERENTIAL/PLATELET
BASOS ABS: 0 10*3/uL (ref 0.0–0.2)
BASOS: 0 %
EOS (ABSOLUTE): 0 10*3/uL (ref 0.0–0.4)
Eos: 1 %
Hematocrit: 42.6 % (ref 37.5–51.0)
Hemoglobin: 13.8 g/dL (ref 13.0–17.7)
IMMATURE GRANS (ABS): 0 10*3/uL (ref 0.0–0.1)
IMMATURE GRANULOCYTES: 0 %
LYMPHS: 27 %
Lymphocytes Absolute: 1.5 10*3/uL (ref 0.7–3.1)
MCH: 27.1 pg (ref 26.6–33.0)
MCHC: 32.4 g/dL (ref 31.5–35.7)
MCV: 84 fL (ref 79–97)
MONOS ABS: 0.4 10*3/uL (ref 0.1–0.9)
Monocytes: 8 %
NEUTROS PCT: 64 %
Neutrophils Absolute: 3.6 10*3/uL (ref 1.4–7.0)
PLATELETS: 264 10*3/uL (ref 150–379)
RBC: 5.1 x10E6/uL (ref 4.14–5.80)
RDW: 15.2 % (ref 12.3–15.4)
WBC: 5.5 10*3/uL (ref 3.4–10.8)

## 2017-07-31 LAB — PROTIME-INR
INR: 1 (ref 0.8–1.2)
Prothrombin Time: 9.9 s (ref 9.1–12.0)

## 2017-08-02 ENCOUNTER — Telehealth: Payer: Self-pay

## 2017-08-02 NOTE — Telephone Encounter (Signed)
[facility] scheduled for:  08/06/2017 @ 1200 Verified arrival time and place:  NT @ 0800 Confirmed AM meds to be taken pre-cath with sip of water: Take ASA Hold enalapril-HCTZ day of Confirmed patient has responsible person to drive home post procedure and observe patient for 24 hours:  yes Addl concerns:  Pt arriving early for hydration for kidney disease

## 2017-08-06 ENCOUNTER — Encounter (HOSPITAL_COMMUNITY): Payer: Self-pay | Admitting: Cardiovascular Disease

## 2017-08-06 ENCOUNTER — Other Ambulatory Visit: Payer: Self-pay

## 2017-08-06 ENCOUNTER — Ambulatory Visit (HOSPITAL_COMMUNITY)
Admission: RE | Admit: 2017-08-06 | Discharge: 2017-08-07 | Disposition: A | Discharge status: Home / Self Care | Payer: Managed Care, Other (non HMO) | Source: Ambulatory Visit | Attending: Cardiovascular Disease | Admitting: Cardiovascular Disease

## 2017-08-06 ENCOUNTER — Encounter (HOSPITAL_COMMUNITY)
Admission: RE | Disposition: A | Discharge status: Home / Self Care | Payer: Self-pay | Source: Ambulatory Visit | Attending: Cardiovascular Disease

## 2017-08-06 DIAGNOSIS — Z955 Presence of coronary angioplasty implant and graft: Secondary | ICD-10-CM

## 2017-08-06 DIAGNOSIS — E669 Obesity, unspecified: Secondary | ICD-10-CM | POA: Diagnosis not present

## 2017-08-06 DIAGNOSIS — E785 Hyperlipidemia, unspecified: Secondary | ICD-10-CM | POA: Diagnosis present

## 2017-08-06 DIAGNOSIS — Z8249 Family history of ischemic heart disease and other diseases of the circulatory system: Secondary | ICD-10-CM | POA: Insufficient documentation

## 2017-08-06 DIAGNOSIS — Z91040 Latex allergy status: Secondary | ICD-10-CM | POA: Insufficient documentation

## 2017-08-06 DIAGNOSIS — Z87891 Personal history of nicotine dependence: Secondary | ICD-10-CM | POA: Diagnosis not present

## 2017-08-06 DIAGNOSIS — Z7982 Long term (current) use of aspirin: Secondary | ICD-10-CM | POA: Insufficient documentation

## 2017-08-06 DIAGNOSIS — Z6829 Body mass index (BMI) 29.0-29.9, adult: Secondary | ICD-10-CM | POA: Insufficient documentation

## 2017-08-06 DIAGNOSIS — I200 Unstable angina: Secondary | ICD-10-CM | POA: Diagnosis not present

## 2017-08-06 DIAGNOSIS — I129 Hypertensive chronic kidney disease with stage 1 through stage 4 chronic kidney disease, or unspecified chronic kidney disease: Secondary | ICD-10-CM | POA: Insufficient documentation

## 2017-08-06 DIAGNOSIS — I25110 Atherosclerotic heart disease of native coronary artery with unstable angina pectoris: Secondary | ICD-10-CM | POA: Diagnosis not present

## 2017-08-06 DIAGNOSIS — N183 Chronic kidney disease, stage 3 (moderate): Secondary | ICD-10-CM | POA: Diagnosis not present

## 2017-08-06 DIAGNOSIS — I10 Essential (primary) hypertension: Secondary | ICD-10-CM | POA: Diagnosis present

## 2017-08-06 DIAGNOSIS — I2582 Chronic total occlusion of coronary artery: Secondary | ICD-10-CM | POA: Diagnosis not present

## 2017-08-06 DIAGNOSIS — I214 Non-ST elevation (NSTEMI) myocardial infarction: Secondary | ICD-10-CM

## 2017-08-06 HISTORY — PX: LEFT HEART CATH AND CORONARY ANGIOGRAPHY: CATH118249

## 2017-08-06 HISTORY — PX: CORONARY STENT INTERVENTION: CATH118234

## 2017-08-06 LAB — TROPONIN I: Troponin I: <0.03 ng/mL (ref <0.03)

## 2017-08-06 LAB — POCT ACTIVATED CLOTTING TIME: Activated Clotting Time: 466 seconds

## 2017-08-06 SURGERY — LEFT HEART CATH AND CORONARY ANGIOGRAPHY
Anesthesia: LOCAL

## 2017-08-06 MED ORDER — SODIUM CHLORIDE 0.9 % IV SOLN
INTRAVENOUS | Status: DC | PRN
  Administered 2017-08-06: 1.75 mg/kg/h via INTRAVENOUS

## 2017-08-06 MED ORDER — ROSUVASTATIN CALCIUM 40 MG PO TABS
40.0000 mg | ORAL_TABLET | Freq: Every day | ORAL | Status: DC
  Filled 2017-08-06: qty 1

## 2017-08-06 MED ORDER — CILOSTAZOL 100 MG PO TABS
100.0000 mg | ORAL_TABLET | Freq: Two times a day (BID) | ORAL | Status: DC
  Filled 2017-08-06: qty 1

## 2017-08-06 MED ORDER — SODIUM CHLORIDE 0.9 % IV SOLN: 250.0000 mL | INTRAVENOUS | Status: DC | PRN

## 2017-08-06 MED ORDER — SODIUM CHLORIDE 0.9 % WEIGHT BASED INFUSION: 1.0000 mL/kg/h | INTRAVENOUS | Status: DC

## 2017-08-06 MED ORDER — IOPAMIDOL (ISOVUE-370) INJECTION 76%
INTRAVENOUS | Status: AC
  Filled 2017-08-06: qty 50

## 2017-08-06 MED ORDER — SODIUM CHLORIDE 0.9% FLUSH: 3.0000 mL | INTRAVENOUS | Status: DC | PRN

## 2017-08-06 MED ORDER — LABETALOL HCL 5 MG/ML IV SOLN: 10.0000 mg | INTRAVENOUS | Status: AC | PRN

## 2017-08-06 MED ORDER — BIVALIRUDIN TRIFLUOROACETATE 250 MG IV SOLR
INTRAVENOUS | Status: AC
  Filled 2017-08-06: qty 250

## 2017-08-06 MED ORDER — IOPAMIDOL (ISOVUE-370) INJECTION 76%
INTRAVENOUS | Status: AC
  Filled 2017-08-06: qty 100

## 2017-08-06 MED ORDER — NIFEDIPINE ER 60 MG PO TB24
60.0000 mg | ORAL_TABLET | Freq: Every day | ORAL | Status: DC
  Filled 2017-08-06: qty 1

## 2017-08-06 MED ORDER — ASPIRIN 81 MG PO CHEW: 81.0000 mg | CHEWABLE_TABLET | ORAL | Status: DC

## 2017-08-06 MED ORDER — HYDROCHLOROTHIAZIDE 25 MG PO TABS: 25.0000 mg | ORAL_TABLET | Freq: Every day | ORAL | Status: DC

## 2017-08-06 MED ORDER — METOPROLOL SUCCINATE ER 25 MG PO TB24: 100.0000 mg | ORAL_TABLET | Freq: Every day | ORAL | Status: DC

## 2017-08-06 MED ORDER — HEPARIN (PORCINE) IN NACL 2-0.9 UNIT/ML-% IJ SOLN
INTRAMUSCULAR | Status: AC
  Filled 2017-08-06: qty 1000

## 2017-08-06 MED ORDER — SODIUM CHLORIDE 0.9% FLUSH
3.0000 mL | Freq: Two times a day (BID) | INTRAVENOUS | Status: DC
Start: 2017-08-06 — End: 2017-08-07

## 2017-08-06 MED ORDER — ENALAPRIL MALEATE 10 MG PO TABS
10.0000 mg | ORAL_TABLET | Freq: Every day | ORAL | Status: DC
  Filled 2017-08-06: qty 1

## 2017-08-06 MED ORDER — SODIUM CHLORIDE 0.9 % IV SOLN
INTRAVENOUS | Status: AC
  Administered 2017-08-06: 17:00:00 via INTRAVENOUS

## 2017-08-06 MED ORDER — SODIUM CHLORIDE 0.9% FLUSH: 3.0000 mL | Freq: Two times a day (BID) | INTRAVENOUS | Status: DC

## 2017-08-06 MED ORDER — ADULT MULTIVITAMIN W/MINERALS CH
1.0000 | ORAL_TABLET | Freq: Every day | ORAL | Status: DC
Start: 2017-08-07 — End: 2017-08-07

## 2017-08-06 MED ORDER — NITROGLYCERIN 1 MG/10 ML FOR IR/CATH LAB
INTRA_ARTERIAL | Status: AC
  Filled 2017-08-06: qty 10

## 2017-08-06 MED ORDER — IOPAMIDOL (ISOVUE-370) INJECTION 76%
INTRAVENOUS | Status: DC | PRN
  Administered 2017-08-06: 200 mL via INTRA_ARTERIAL

## 2017-08-06 MED ORDER — HEPARIN (PORCINE) IN NACL 2-0.9 UNIT/ML-% IJ SOLN
INTRAMUSCULAR | Status: AC | PRN
  Administered 2017-08-06: 1500 mL

## 2017-08-06 MED ORDER — LIDOCAINE HCL (PF) 1 % IJ SOLN
INTRAMUSCULAR | Status: DC | PRN
  Administered 2017-08-06: 20 mL
  Administered 2017-08-06: 2 mL

## 2017-08-06 MED ORDER — POTASSIUM CHLORIDE CRYS ER 10 MEQ PO TBCR
20.0000 meq | EXTENDED_RELEASE_TABLET | Freq: Two times a day (BID) | ORAL | Status: DC
  Administered 2017-08-06: 21:00:00 20 meq via ORAL
  Filled 2017-08-06 (×3): qty 2

## 2017-08-06 MED ORDER — FENTANYL CITRATE (PF) 100 MCG/2ML IJ SOLN
INTRAMUSCULAR | Status: AC
  Filled 2017-08-06: qty 2

## 2017-08-06 MED ORDER — HYDRALAZINE HCL 20 MG/ML IJ SOLN: 5.0000 mg | INTRAMUSCULAR | Status: AC | PRN

## 2017-08-06 MED ORDER — HEPARIN SODIUM (PORCINE) 1000 UNIT/ML IJ SOLN
INTRAMUSCULAR | Status: AC
  Filled 2017-08-06: qty 1

## 2017-08-06 MED ORDER — CLOPIDOGREL BISULFATE 300 MG PO TABS
ORAL_TABLET | ORAL | Status: AC
  Filled 2017-08-06: qty 2

## 2017-08-06 MED ORDER — TIMOLOL MALEATE 0.5 % OP SOLN
1.0000 [drp] | Freq: Two times a day (BID) | OPHTHALMIC | Status: DC
  Filled 2017-08-06: qty 5

## 2017-08-06 MED ORDER — ENALAPRIL-HYDROCHLOROTHIAZIDE 10-25 MG PO TABS: 1.0000 | ORAL_TABLET | Freq: Every day | ORAL | Status: DC

## 2017-08-06 MED ORDER — CLOPIDOGREL BISULFATE 300 MG PO TABS
ORAL_TABLET | ORAL | Status: DC | PRN
  Administered 2017-08-06: 600 mg via ORAL

## 2017-08-06 MED ORDER — MIDAZOLAM HCL 2 MG/2ML IJ SOLN
INTRAMUSCULAR | Status: AC
  Filled 2017-08-06: qty 2

## 2017-08-06 MED ORDER — LIDOCAINE HCL (PF) 1 % IJ SOLN
INTRAMUSCULAR | Status: AC
  Filled 2017-08-06: qty 30

## 2017-08-06 MED ORDER — FENTANYL CITRATE (PF) 100 MCG/2ML IJ SOLN
INTRAMUSCULAR | Status: DC | PRN
  Administered 2017-08-06 (×2): 25 ug via INTRAVENOUS

## 2017-08-06 MED ORDER — ACETAMINOPHEN 325 MG PO TABS: 650.0000 mg | ORAL_TABLET | ORAL | Status: DC | PRN

## 2017-08-06 MED ORDER — BIVALIRUDIN BOLUS VIA INFUSION - CUPID
INTRAVENOUS | Status: DC | PRN
  Administered 2017-08-06: 72.45 mg via INTRAVENOUS

## 2017-08-06 MED ORDER — VERAPAMIL HCL 2.5 MG/ML IV SOLN
INTRAVENOUS | Status: AC
  Filled 2017-08-06: qty 2

## 2017-08-06 MED ORDER — SODIUM CHLORIDE 0.9 % WEIGHT BASED INFUSION
3.0000 mL/kg/h | INTRAVENOUS | Status: DC
  Administered 2017-08-06: 3 mL/kg/h via INTRAVENOUS

## 2017-08-06 MED ORDER — ONDANSETRON HCL 4 MG/2ML IJ SOLN: 4.0000 mg | Freq: Four times a day (QID) | INTRAMUSCULAR | Status: DC | PRN

## 2017-08-06 MED ORDER — CLOPIDOGREL BISULFATE 75 MG PO TABS: 75.0000 mg | ORAL_TABLET | Freq: Every day | ORAL | Status: DC

## 2017-08-06 MED ORDER — NITROGLYCERIN 1 MG/10 ML FOR IR/CATH LAB
INTRA_ARTERIAL | Status: DC | PRN
Start: 2017-08-06 — End: 2017-08-06
  Administered 2017-08-06 (×2): 200 ug via INTRACORONARY

## 2017-08-06 MED ORDER — ASPIRIN EC 81 MG PO TBEC: 81.0000 mg | DELAYED_RELEASE_TABLET | Freq: Every day | ORAL | Status: DC

## 2017-08-06 MED ORDER — MIDAZOLAM HCL 2 MG/2ML IJ SOLN
INTRAMUSCULAR | Status: DC | PRN
  Administered 2017-08-06: 2 mg via INTRAVENOUS
  Administered 2017-08-06: 1 mg via INTRAVENOUS

## 2017-08-06 SURGICAL SUPPLY — 20 items
BALLN SAPPHIRE 2.5X12 (BALLOONS) ×2
BALLN SAPPHIRE ~~LOC~~ 4.0X12 (BALLOONS) ×2 IMPLANT
BALLOON SAPPHIRE 2.5X12 (BALLOONS) ×1 IMPLANT
CATH INFINITI 5FR MULTPACK ANG (CATHETERS) ×2 IMPLANT
CATH VISTA GUIDE 6FR XBLAD3.5 (CATHETERS) ×2 IMPLANT
COVER PRB 48X5XTLSCP FOLD TPE (BAG) ×1 IMPLANT
COVER PROBE 5X48 (BAG) ×1
GLIDESHEATH SLEND SS 6F .021 (SHEATH) ×2 IMPLANT
GUIDEWIRE INQWIRE 1.5J.035X260 (WIRE) ×1 IMPLANT
INQWIRE 1.5J .035X260CM (WIRE) ×2
KIT ENCORE 26 ADVANTAGE (KITS) ×2 IMPLANT
KIT HEART LEFT (KITS) ×2 IMPLANT
PACK CARDIAC CATHETERIZATION (CUSTOM PROCEDURE TRAY) ×2 IMPLANT
SHEATH PINNACLE 5F 10CM (SHEATH) ×2 IMPLANT
SHEATH PINNACLE 6F 10CM (SHEATH) ×2 IMPLANT
STENT PROMUS PREM MR 4.0X16 (Permanent Stent) ×2 IMPLANT
TRANSDUCER W/STOPCOCK (MISCELLANEOUS) ×2 IMPLANT
TUBING CIL FLEX 10 FLL-RA (TUBING) ×2 IMPLANT
WIRE .035 3MM-J 145CM (WIRE) ×2 IMPLANT
WIRE COUGAR XT STRL 190CM (WIRE) ×4 IMPLANT

## 2017-08-06 NOTE — Research (Signed)
OPTIMIZE Informed Consent   Subject Name: Shaun [name]  Subject met inclusion and exclusion criteria.  The informed consent form, study requirements and expectations were reviewed with the subject and questions and concerns were addressed prior to the signing of the consent form.  The subject verbalized understanding of the trail requirements.  The subject agreed to participate in the [facility] trial and signed the informed consent.  The informed consent was obtained prior to performance of any protocol-specific procedures for the subject.  A copy of the signed informed consent was given to the subject and a copy was placed in the subject's medical record. Only applicable if randomized.   Philemon Kingdom D 08/06/2017, 0919 am

## 2017-08-06 NOTE — Progress Notes (Signed)
Site area: right groin  Site Prior to Removal:  Level 0  Pressure Applied For 20 MINUTES    Minutes Beginning at 1435  Manual:   Yes.    Patient Status During Pull:  stable  Post Pull Groin Site:  Level 0  Post Pull Instructions Given:  Yes.    Post Pull Pulses Present:  Yes.    Dressing Applied:  Yes.    Comments:  Rechecked site remainder of shift with no change noted. Dressing dry and intact. Pulses palpable

## 2017-08-06 NOTE — Interval H&P Note (Signed)
History and Physical Interval Note:  08/06/2017 10:41 AM  Shaun [name]  has presented today for surgery, with the diagnosis of unstable angina. The various methods of treatment have been discussed with the patient and family. After consideration of risks, benefits and other options for treatment, the patient has consented to  Procedure(s): LEFT HEART CATH AND CORONARY ANGIOGRAPHY (N/A) as a surgical intervention .  The patient's history has been reviewed, patient examined, no change in status, stable for surgery.  I have reviewed the patient's chart and labs.  Questions were answered to the patient's satisfaction.    Cath Lab Visit (complete for each Cath Lab visit)  Clinical Evaluation Leading to the Procedure:   ACS: No.  Non-ACS:    Anginal Classification: CCS II  Anti-ischemic medical therapy: Minimal Therapy (1 class of medications)  Non-Invasive Test Results: CT FFR suggesting flow limiting lesions in the LAD and Circumflex distribution with what appears to be at least moderate left main stenosis on Coronary CTA.   Prior CABG: No previous CABG         Verne Carrowhristopher McAlhany

## 2017-08-07 ENCOUNTER — Encounter (HOSPITAL_COMMUNITY): Payer: Self-pay | Admitting: Cardiology

## 2017-08-07 DIAGNOSIS — I214 Non-ST elevation (NSTEMI) myocardial infarction: Secondary | ICD-10-CM | POA: Diagnosis not present

## 2017-08-07 LAB — TROPONIN I: Troponin I: 0.04 ng/mL (ref <0.03)

## 2017-08-07 LAB — CBC
HEMATOCRIT: 37.4 % — AB (ref 39.0–52.0)
Hemoglobin: 12.1 g/dL — ABNORMAL LOW (ref 13.0–17.0)
MCH: 26.5 pg (ref 26.0–34.0)
MCHC: 32.4 g/dL (ref 30.0–36.0)
MCV: 81.8 fL (ref 78.0–100.0)
PLATELETS: 204 10*3/uL (ref 150–400)
RBC: 4.57 MIL/uL (ref 4.22–5.81)
RDW: 14.5 % (ref 11.5–15.5)
WBC: 6.7 10*3/uL (ref 4.0–10.5)

## 2017-08-07 LAB — BASIC METABOLIC PANEL
Anion gap: 11 (ref 5–15)
BUN: 9 mg/dL (ref 6–20)
CHLORIDE: 100 mmol/L — AB (ref 101–111)
CO2: 25 mmol/L (ref 22–32)
CREATININE: 1.12 mg/dL (ref 0.61–1.24)
Calcium: 8.4 mg/dL — ABNORMAL LOW (ref 8.9–10.3)
GFR calc Af Amer: >60 mL/min (ref >60)
GLUCOSE: 111 mg/dL — AB (ref 65–99)
POTASSIUM: 3.1 mmol/L — AB (ref 3.5–5.1)
SODIUM: 136 mmol/L (ref 135–145)

## 2017-08-07 MED ORDER — HYDRALAZINE HCL 20 MG/ML IJ SOLN
10.0000 mg | Freq: Four times a day (QID) | INTRAMUSCULAR | Status: DC | PRN
  Filled 2017-08-07: qty 1

## 2017-08-07 MED ORDER — ENALAPRIL MALEATE 20 MG PO TABS: 20.0000 mg | ORAL_TABLET | Freq: Every day | ORAL | 2 refills | Status: DC

## 2017-08-07 MED ORDER — CLOPIDOGREL BISULFATE 75 MG PO TABS: 75.0000 mg | ORAL_TABLET | Freq: Every day | ORAL | 2 refills | Status: DC

## 2017-08-07 MED ORDER — ENALAPRIL MALEATE 20 MG PO TABS: 10.0000 mg | ORAL_TABLET | Freq: Every day | ORAL | 2 refills | Status: DC

## 2017-08-07 MED ORDER — POTASSIUM CHLORIDE CRYS ER 20 MEQ PO TBCR
40.0000 meq | EXTENDED_RELEASE_TABLET | Freq: Once | ORAL | Status: AC
  Administered 2017-08-07: 10:00:00 40 meq via ORAL
  Filled 2017-08-07: qty 2

## 2017-08-07 MED ORDER — ANGIOPLASTY BOOK
Freq: Once | Status: DC
  Filled 2017-08-07: qty 1

## 2017-08-07 MED ORDER — HYDROCHLOROTHIAZIDE 25 MG PO TABS: 25.0000 mg | ORAL_TABLET | Freq: Every day | ORAL | 2 refills | Status: DC

## 2017-08-07 NOTE — Discharge Summary (Signed)
Discharge Summary    Patient ID: Shaun [name],  MRN: [id number], DOB/AGE: 1961-06-10 56 y.o.  Admit date: 08/06/2017 Discharge date: 08/07/2017  Primary Care Provider: Leeann Must D Primary Cardiologist: Duke Salvia  Discharge Diagnoses    Active Problems:   Unstable angina [facility])   Essential hypertension   Hyperlipidemia   Non-ST elevation (NSTEMI) myocardial infarction [facility])   Allergies Allergies  Allergen Reactions  . Latex Rash    Diagnostic Studies/Procedures    Cath: 08/06/17  Conclusion     Mid RCA lesion is 60% stenosed.  Ost LPDA lesion is 100% stenosed.  Ost Cx lesion is 40% stenosed.  Ost LM to Dist LM lesion is 10% stenosed.  Ost 1st Diag lesion is 100% stenosed.  Prox LAD to Mid LAD lesion is 80% stenosed.  A drug-eluting stent was successfully placed using a STENT PROMUS PREM MR 4.0X16.  Post intervention, there is a 0% residual stenosis.   1. Severe stenosis mid LAD. Successful PTCA/DES x 1 mid LAD 2. Mild left main stenosis 3. Chronic occlusion small diagonal branch 4. Chronic occlusion distal Circumflex with filling by right to left collaterals 5. Moderate mid RCA stenosis. (non-dominant RCA)  Recommendations: DAPT with ASA and PLavix for one year.     _____________   History of Present Illness     56 y.o. male with CAD, hypertension and hyperlipidemia who presented to the office for an evaluation of chest pain.  He was seen in the ED 9/29 with two days of pleuritic chest pain that improved with walking.  He also reported indigestion.  EKG showed sinus tachycardia at 123 bpm and no ischemia.  Cardiac enzymes were negative x2.  He reported that prior to going to the ED he had 3 days of constant, left-sided chest pain.  The symptoms started at rest and improved with walking.  There was no associated shortness of breath, nausea or diaphoresis.  He was seen in clinic 06/2017 and referred for a coronary CT-A that revealed 40-50% distal  left main disease.  FFR of the mid LAD was 0.71 and the distal left circumflex was 0.75, both consistent with ischemia.  His coronary calcium score was in the 98th percentile.   Mr. Bertz father died of a heart attack at age 15.  At his last appointment Mr. Sautter's blood pressure was noted to be elevated. He was asked to check his blood pressure at home.  He kept a log of his BP but didn't bring it with him to his follow up appt. He had been walking for exercise for 5 times per week.  He tended to walk for 25-30 minutes.  He reported no exertional chest pain or shortness of breath. Given his CT findings and reports of chest pain, he was set up for outpatient cardiac cath.   Hospital Course     Underwent cardiac cath noted above with PCI/DES x1 to mLAD with mild left main disease, along with chronic occlusion of small diag with dLcx that fills via R->L collaterals. Plan for DAPT with ASA/Plavix for at least one year, along with aggressive risk factor modifications. No LV gram given recent echo. Post cath labs showed mild hypokalemia that was corrected. No complications. Worked well with cardiac rehab. He was noted to be hypertensive this admission, therefore his enalapril was increased to 20mg , and HCTZ was continued at 25mg  daily.   General: Well developed, well nourished, male appearing in no acute distress. Head: Normocephalic, atraumatic.  Neck: Supple without  bruits, JVD. Lungs:  Resp regular and unlabored, CTA. Heart: RRR, S1, S2, no S3, S4, or murmur; no rub. Abdomen: Soft, non-tender, non-distended with normoactive bowel sounds. No hepatomegaly. No rebound/guarding. No obvious abdominal masses. Extremities: No clubbing, cyanosis, edema. Distal pedal pulses are 2+ bilaterally. R fermoral cath site stable without bruising or hematoma Neuro: Alert and oriented X 3. Moves all extremities spontaneously. Psych: Normal affect.  Umar Lorenzi was seen by Dr. Swaziland and determined stable for  discharge home. Follow up in the office has been arranged. Medications are listed below.   _____________  Discharge Vitals Blood pressure (!) 177/102, pulse 91, temperature 98.4 F (36.9 C), temperature source Oral, resp. rate (!) 31, height 5\' 11"  (1.803 m), weight 214 lb 8.1 oz (97.3 kg), SpO2 97 %.  Filed Weights   08/06/17 0800 08/07/17 0155  Weight: 213 lb (96.6 kg) 214 lb 8.1 oz (97.3 kg)    Labs & Radiologic Studies    CBC Recent Labs    08/07/17 0514  WBC 6.7  HGB 12.1*  HCT 37.4*  MCV 81.8  PLT 204   Basic Metabolic Panel Recent Labs    16/10/96 0514  NA 136  K 3.1*  CL 100*  CO2 25  GLUCOSE 111*  BUN 9  CREATININE 1.12  CALCIUM 8.4*   Liver Function Tests No results for input(s): AST, ALT, ALKPHOS, BILITOT, PROT, ALBUMIN in the last 72 hours. No results for input(s): LIPASE, AMYLASE in the last 72 hours. Cardiac Enzymes Recent Labs    08/06/17 1145 08/06/17 1625 08/07/17 0514  TROPONINI <0.03 <0.03 0.04*   BNP Invalid input(s): POCBNP D-Dimer No results for input(s): DDIMER in the last 72 hours. Hemoglobin A1C No results for input(s): HGBA1C in the last 72 hours. Fasting Lipid Panel No results for input(s): CHOL, HDL, LDLCALC, TRIG, CHOLHDL, LDLDIRECT in the last 72 hours. Thyroid Function Tests No results for input(s): TSH, T4TOTAL, T3FREE, THYROIDAB in the last 72 hours.  Invalid input(s): FREET3 _____________  Ct Coronary Morph W/cta Cor W/score W/ca W/cm &/or Wo/cm  Addendum Date: 07/10/2017   ADDENDUM REPORT: 07/10/2017 22:56 CLINICAL DATA:  Chest pain EXAM: Cardiac CTA MEDICATIONS: Sub lingual nitro. 4mg  and lopressor 25mg  IV total TECHNIQUE: The patient was scanned on a Siemens Force 192 slice scanner. Gantry rotation speed was 240 msecs. Collimation was 0.6 mm. A 100 kV prospective scan was triggered in the ascending thoracic aorta at 35-75% of the R-R interval. Average HR during the scan was 70 bpm. The 3D data set was interpreted on a  dedicated work station using MPR, MIP and VRT modes. A total of 80cc of contrast was used. FINDINGS: Non-cardiac: See separate report from [facility]. Calcium Score:  628 Agatston units. Coronary Arteries:  Left dominant with no anomalies LM: Large caliber vessel with distal left main mixed plaque, 40-50% stenosis. LAD system: Calcified plaque proximal LAD with mild stenosis. Mixed plaque in the mid LAD with possible moderate stenosis. Circumflex system: Mixed plaque in the mid LCx with mild stenosis. Large vessel, provides left-sided PDA. RCA system: Small RCA. Interpretation of this artery is limited by artifact. There is calcified plaque in the mid vessel but difficult to tell degree of stenosis. IMPRESSION: 1.  40-50% distal left main stenosis. 2.  Possible moderate mid LAD stenosis. 3. Coronary artery calcium score 628 Agatston units, placing the patient in the 98th percentile for age and gender. This suggests high risk for future cardiac events. This study will be sent for CT  FFR. Marca Ancona Electronically Signed   By: Marca Ancona M.D.   On: 07/10/2017 22:56   Result Date: 07/10/2017 EXAM: OVER-READ INTERPRETATION  CT CHEST The following report is an over-read performed by radiologist Dr. Maryelizabeth [facility] on 07/10/2017. This over-read does not include interpretation of cardiac or coronary anatomy or pathology. The coronary CTA interpretation by the cardiologist is attached. COMPARISON:  None. FINDINGS: Limited view of the lung parenchyma demonstrates no suspicious nodularity. Airways are normal. Limited view of the mediastinum demonstrates no adenopathy. Esophagus normal. Limited view of the upper abdomen unremarkable. Limited view of the skeleton and chest wall is unremarkable. IMPRESSION: No significant extracardiac findings. Electronically Signed: By: Genevive Bi M.D. On: 07/10/2017 12:40   Ct Coronary Fractional Flow Reserve Data Prep  Result Date:  07/24/2017 CLINICAL DATA:  CT FFR sent for coronary CT angiogram. FFR 0.71 in the mid LAD, suggesting a hemodynamically significant stenosis. FFR 0.75 in the distal LCx, suggesting a hemodynamically significant stenosis. Cardiac catheterization would be reasonable based on this data. MEDICATIONS: MEDICATIONS None Marca Ancona Electronically Signed   By: Marca Ancona M.D.   On: 07/13/2017 16:27   Ct Coronary Fractional Flow Reserve Fluid Analysis  Result Date: 07/24/2017 CLINICAL DATA:  CT FFR sent for coronary CT angiogram. FFR 0.71 in the mid LAD, suggesting a hemodynamically significant stenosis. FFR 0.75 in the distal LCx, suggesting a hemodynamically significant stenosis. Cardiac catheterization would be reasonable based on this data. MEDICATIONS: MEDICATIONS None Marca Ancona Electronically Signed   By: Marca Ancona M.D.   On: 07/13/2017 16:27   Disposition   Pt is being discharged home today in good condition.  Follow-up Plans & Appointments    Follow-up Information    Abelino Derrick, PA-C Follow up on 08/23/2017.   Specialties:  Cardiology, Radiology Why:  at 8:30am for your follow up appt.  Contact information: [address] [place] [address] [phone]          Discharge Instructions    Amb Referral to Cardiac Rehabilitation   Complete by:  As directed    Diagnosis:  Coronary Stents   Diet - low sodium heart healthy   Complete by:  As directed    Discharge instructions   Complete by:  As directed    Groin Site Care Refer to this sheet in the next few weeks. These instructions provide you with information on caring for yourself after your procedure. Your caregiver may also give you more specific instructions. Your treatment has been planned according to current medical practices, but problems sometimes occur. Call your caregiver if you have any problems or questions after your procedure. HOME CARE INSTRUCTIONS You may shower 24 hours after the  procedure. Remove the bandage (dressing) and gently wash the site with plain soap and water. Gently pat the site dry.  Do not apply powder or lotion to the site.  Do not sit in a bathtub, swimming pool, or whirlpool for 5 to 7 days.  No bending, squatting, or lifting anything over 10 pounds (4.5 kg) as directed by your caregiver.  Inspect the site at least twice daily.  Do not drive home if you are discharged the same day of the procedure. Have someone else drive you.  You may drive 24 hours after the procedure unless otherwise instructed by your caregiver.  What to expect: Any bruising will usually fade within 1 to 2 weeks.  Blood that collects in the tissue (hematoma) may be  painful to the touch. It should usually decrease in size and tenderness within 1 to 2 weeks.  SEEK IMMEDIATE MEDICAL CARE IF: You have unusual pain at the groin site or down the affected leg.  You have redness, warmth, swelling, or pain at the groin site.  You have drainage (other than a small amount of blood on the dressing).  You have chills.  You have a fever or persistent symptoms for more than 72 hours.  You have a fever and your symptoms suddenly get worse.  Your leg becomes pale, cool, tingly, or numb.  You have heavy bleeding from the site. Hold pressure on the site. Marland Kitchen.  PLEASE DO NOT MISS ANY DOSES OF YOUR PLAVIX!!!!! Also keep a log of you blood pressures and bring back to your follow up appt. Please call the office with any questions.   Patients taking blood thinners should generally stay away from medicines like ibuprofen, Advil, Motrin, naproxen, and Aleve due to risk of stomach bleeding. You may take Tylenol as directed or talk to your primary doctor about alternatives.   Increase activity slowly   Complete by:  As directed       Discharge Medications     Medication List    STOP taking these medications   cilostazol 100 MG tablet Commonly known as:  PLETAL   enalapril-hydrochlorothiazide 10-25  MG tablet Commonly known as:  VASERETIC   naproxen sodium 220 MG tablet Commonly known as:  ALEVE     TAKE these medications   aspirin EC 81 MG tablet Take 81 mg by mouth daily.   clopidogrel 75 MG tablet Commonly known as:  PLAVIX Take 1 tablet (75 mg total) by mouth daily with breakfast.   enalapril 20 MG tablet Commonly known as:  VASOTEC Take 1 tablet (20 mg total) by mouth daily.   hydrochlorothiazide 25 MG tablet Commonly known as:  HYDRODIURIL Take 1 tablet (25 mg total) by mouth daily.   metoprolol succinate 100 MG 24 hr tablet Commonly known as:  TOPROL-XL Take 1 tablet (100 mg total) daily by mouth.   MULTI-VITAMINS Tabs Take 1 tablet by mouth daily.   NIFEdipine 60 MG 24 hr tablet Commonly known as:  PROCARDIA-XL/ADALAT CC Take 60 mg by mouth daily.   potassium chloride 10 MEQ tablet Commonly known as:  K-DUR Take 2 tablets (20 mEq total) 2 (two) times daily by mouth.   rosuvastatin 40 MG tablet Commonly known as:  CRESTOR Take 1 tablet (40 mg total) daily by mouth.   timolol 0.5 % ophthalmic solution Commonly known as:  TIMOPTIC Place 1 drop into both eyes 2 (two) times daily.        Aspirin prescribed at discharge?  Yes High Intensity Statin Prescribed? (Lipitor 40-80mg  or Crestor 20-40mg ): Yes Beta Blocker Prescribed? Yes For EF <40%, was ACEI/ARB Prescribed? Yes ADP Receptor Inhibitor Prescribed? (i.e. Plavix etc.-Includes Medically Managed Patients): Yes For EF <40%, Aldosterone Inhibitor Prescribed? No: EF ok Was EF assessed during THIS hospitalization? Yes Was Cardiac Rehab II ordered? (Included Medically managed Patients): Yes   Outstanding Labs/Studies   BMET at follow up appt.   Duration of Discharge Encounter   Greater than 30 minutes including physician time.  Signed, Laverda PageLindsay Roberts NP-C 08/07/2017, 9:05 AM Patient seen and examined and history reviewed. Agree with above findings and plan. Patient feels well this am. No  complaints of chest pain. Ambulating in halls. BP elevated. Will increase Vasotec to 20 mg daily. Groin without hematoma. Lungs are  clear. No murmur or gallop.  Labs are stable. Plan DC home today.  Kennley Schwandt SwazilandJordan, MDFACC 08/07/2017 9:25 AM

## 2017-08-07 NOTE — Progress Notes (Signed)
Patient stated he wanted to take his meds when he got home today. Given potassium 40 meqs prior to discharge as instructed, will take all other medications at home. Patient discharged ambulatory at 0945.

## 2017-08-07 NOTE — Progress Notes (Signed)
CARDIAC REHAB PHASE I   PRE:  Rate/Rhythm: 90 SR  BP:  Sitting: 177/101        SaO2: 97 RA  MODE:  Ambulation: 600 ft   POST:  Rate/Rhythm: 105 ST  BP:  Sitting: 177/102         SaO2: 100 RA  Pt ambulated 600 ft on RA, independent, steady gait, tolerated well with no complaints, BP elevated. Completed PCI/stent education.  Reviewed risk factors, anti-platelet therapy, stent card, activity restrictions, ntg, exercise, heart healthy diet and phase 2 cardiac rehab. Pt verbalized understanding. Pt agrees to phase 2 cardiac rehab referral, [facility]. Pt up ad lib, awaiting discharge.  [phone] Joylene GrapesEmily C Shalay Carder, RN, BSN 08/07/2017 9:02 AM

## 2017-08-07 NOTE — Care Management Note (Signed)
Case Management Note  Patient Details  Name: Shaun DowningDarryl [name] MRN: [id number] Date of Birth: 1960-10-09  Subjective/Objective:  From home, s/p coronary stent intervention, will be on plavix.                    Action/Plan: NCM will follow for dc needs.   Expected Discharge Date:  08/07/17               Expected Discharge Plan:  Home/Self Care  In-House Referral:     Discharge planning Services  CM Consult  Post Acute Care Choice:    Choice offered to:     DME Arranged:    DME Agency:     HH Arranged:    HH Agency:     Status of Service:  Completed, signed off  If discussed at [employer] Length of Stay Meetings, dates discussed:    Additional Comments:  Shaun [name], Shaun Empie Clinton, RN 08/07/2017, 9:42 AM

## 2017-08-08 ENCOUNTER — Telehealth (HOSPITAL_COMMUNITY): Payer: Self-pay

## 2017-08-08 NOTE — Telephone Encounter (Signed)
Patient's insurance is active and benefits verified through [place] - No co-payment, deductible amount of $750.00.$750.00 has been met, out of pocket amount of $2,700/$2,306.16 has been met, 20% co-insurance, and no-pre-authorization is required. Passport/reference [phone]  Patient will be contacted and scheduled after RN review.

## 2017-08-10 ENCOUNTER — Telehealth (HOSPITAL_COMMUNITY): Payer: Self-pay

## 2017-08-10 NOTE — Telephone Encounter (Signed)
Called patient in regards to Cardiac Rehab - patient stated he would like to participate in the program but would like to contact his insurance company first. Will follow up.

## 2017-08-21 ENCOUNTER — Telehealth (HOSPITAL_COMMUNITY): Payer: Self-pay

## 2017-08-21 NOTE — Telephone Encounter (Signed)
Called to follow up with patient in regards to Cardiac Rehab - Patient has not spoke with his insurance company. Will follow up next week if no phone call from patient.

## 2017-08-23 ENCOUNTER — Telehealth (HOSPITAL_COMMUNITY): Payer: Self-pay

## 2017-08-23 ENCOUNTER — Encounter: Payer: Self-pay | Admitting: Cardiology

## 2017-08-23 ENCOUNTER — Ambulatory Visit: Payer: Managed Care, Other (non HMO) | Admitting: Cardiology

## 2017-08-23 VITALS — BP 140/85 | HR 77 | Ht 71.0 in | Wt 212.2 lb

## 2017-08-23 DIAGNOSIS — Z9861 Coronary angioplasty status: Secondary | ICD-10-CM

## 2017-08-23 DIAGNOSIS — I10 Essential (primary) hypertension: Secondary | ICD-10-CM

## 2017-08-23 DIAGNOSIS — E7800 Pure hypercholesterolemia, unspecified: Secondary | ICD-10-CM | POA: Diagnosis not present

## 2017-08-23 DIAGNOSIS — Z8249 Family history of ischemic heart disease and other diseases of the circulatory system: Secondary | ICD-10-CM

## 2017-08-23 DIAGNOSIS — I2510 Atherosclerotic heart disease of native coronary artery without angina pectoris: Secondary | ICD-10-CM

## 2017-08-23 MED ORDER — HYDROCHLOROTHIAZIDE 25 MG PO TABS: 25.0000 mg | ORAL_TABLET | Freq: Every day | ORAL | 3 refills | Status: DC

## 2017-08-23 MED ORDER — METOPROLOL SUCCINATE ER 100 MG PO TB24: 100.0000 mg | ORAL_TABLET | Freq: Every day | ORAL | 3 refills | Status: DC

## 2017-08-23 MED ORDER — CLOPIDOGREL BISULFATE 75 MG PO TABS: 75.0000 mg | ORAL_TABLET | Freq: Every day | ORAL | 3 refills | Status: DC

## 2017-08-23 NOTE — Patient Instructions (Addendum)
Lab work ( lipid and liver panels )  5 weeks    Your physician recommends that you schedule a follow-up appointment with Dr.Magna   Tuesday 11/13/17 at 8:00 am

## 2017-08-23 NOTE — Assessment & Plan Note (Signed)
Elective cath/ LAD PCI with DES 08/06/17. Residual Dx and non dominant RCA disease, normal LVF

## 2017-08-23 NOTE — Addendum Note (Signed)
Addended by: Neoma LamingPUGH, Haik Mahoney J on: 08/23/2017 08:42 AM   Modules accepted: Orders

## 2017-08-23 NOTE — Assessment & Plan Note (Signed)
B/P reading from home reviewed 118/78

## 2017-08-23 NOTE — Telephone Encounter (Signed)
Patient returned phone call yesterday - Returned patients phone call. He would like to stop by the facility after speaking with his insurance company. Patient stated he will stop by on Wednesday.

## 2017-08-23 NOTE — Assessment & Plan Note (Signed)
Zocor 20 mg changed to Crestor 40 mg, will check f/u lipids and LFTs in 5 weeks

## 2017-08-23 NOTE — Progress Notes (Addendum)
08/23/2017 Shaun [name]   [birth year]  [id number]  Primary Physician Frederico HammanMiller, Carlton D, MD Primary Cardiologist: Dr Duke Salviaandolph  HPI:  Shaun [name] is a 56 y/o male seen by Dr Duke Salviaandolph in Oct 2018 after he was seen in the ED in Sept for chest pain felt to be atypical. The pt did have cardiac risk factors including FM Hx, his father had 3 MIs, died at 58 y/o. A coronary CTA was obtained in Nov and was positive for significant CAD. The pt was admitted for an elective cath 08/06/17. This revealed significant LAD disease and he underwent PCI with DES. He has residual disease in the Dx and RCA. His LVF is normal by echo. He is seen in the office today for post PCI f/u.   Since DC he has done well. 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 He has some discomfort at his Lt radial site, they were unable to obtain access there.    Current Outpatient Medications  Medication Sig Dispense Refill  . aspirin EC 81 MG tablet Take 81 mg by mouth daily.    . clopidogrel (PLAVIX) 75 MG tablet Take 1 tablet (75 mg total) by mouth daily with breakfast. 90 tablet 2  . enalapril (VASOTEC) 20 MG tablet Take 1 tablet (20 mg total) by mouth daily. 30 tablet 2  . hydrochlorothiazide (HYDRODIURIL) 25 MG tablet Take 1 tablet (25 mg total) by mouth daily. 30 tablet 2  . metoprolol succinate (TOPROL-XL) 100 MG 24 hr tablet Take 1 tablet (100 mg total) daily by mouth. 90 tablet 3  . Multiple Vitamin (MULTI-VITAMINS) TABS Take 1 tablet by mouth daily.    Marland Kitchen. NIFEdipine (PROCARDIA-XL/ADALAT CC) 60 MG 24 hr tablet Take 60 mg by mouth daily.    . potassium chloride (K-DUR) 10 MEQ tablet Take 2 tablets (20 mEq total) 2 (two) times daily by mouth. 360 tablet 3  . rosuvastatin (CRESTOR) 40 MG tablet Take 1 tablet (40 mg total) daily by mouth. 90 tablet 3  . timolol (TIMOPTIC) 0.5 % ophthalmic solution Place 1 drop into both eyes 2 (two) times daily.  0   No  current facility-administered medications for this visit.     Allergies  Allergen Reactions  . Latex Rash    Past Medical History:  Diagnosis Date  . Atypical chest pain 06/05/2017  . CAD in native artery 07/19/2017   12/18 PCI/DESx1 to mLAD, residual disease in LM, with CTO of dLcx  . Essential hypertension 06/05/2017  . Family history of premature CAD 06/05/2017  . Hyperlipidemia with target low density lipoprotein (LDL) cholesterol less than 100 mg/dL   . Hypertension     Social History   Socioeconomic History  . Marital status: Single    Spouse name: Not on file  . Number of children: Not on file  . Years of education: Not on file  . Highest education level: Not on file  Social Needs  . Financial resource strain: Not on file  . Food insecurity - worry: Not on file  . Food insecurity - inability: Not on file  . Transportation needs - medical: Not on file  . Transportation needs - non-medical: Not on file  Occupational History  . Not on file  Tobacco Use  . Smoking status: Former Smoker    Packs/day: 1.00    Years: 15.00    Pack years: 15.00  Types: Cigarettes  . Smokeless tobacco: Never Used  . Tobacco comment: 08/06/2017 "nothing since 1990s"  Substance and Sexual Activity  . Alcohol use: Yes    Alcohol/week: 2.4 oz    Types: 4 Shots of liquor per week  . Drug use: Yes    Types: Marijuana    Comment: 08/06/2017 "nothing  since 2016"  . Sexual activity: Yes  Other Topics Concern  . Not on file  Social History Narrative  . Not on file     Family History  Problem Relation Age of Onset  . CAD Father   . Heart attack Father   . Stroke Brother   . Heart attack Paternal Grandmother   . Heart attack Paternal Uncle      Review of Systems: General: negative for chills, fever, night sweats or weight changes.  Cardiovascular: negative for chest pain, dyspnea on exertion, edema, orthopnea, palpitations, paroxysmal nocturnal dyspnea or shortness of  breath Dermatological: negative for rash Respiratory: negative for cough or wheezing Urologic: negative for hematuria Abdominal: negative for nausea, vomiting, diarrhea, Landen red blood per rectum, melena, or hematemesis Neurologic: negative for visual changes, syncope, or dizziness All other systems reviewed and are otherwise negative except as noted above.    Blood pressure 140/85, pulse 77, height 5\' 11"  (1.803 m), weight 212 lb 3.2 oz (96.3 kg).  General appearance: alert, cooperative and no distress Neck: no carotid bruit and no JVD Lungs: clear to auscultation bilaterally and he has keloids on his chest Heart: regular rate and rhythm Pulses: 2+ and symmetric Lt radial site without hematoma, decreases Lt RA pulse Skin: Skin color, texture, turgor normal. No rashes or lesions Neurologic: Grossly normal   ASSESSMENT AND PLAN:   CAD S/P percutaneous coronary angioplasty Elective cath/ LAD PCI with DES 08/06/17. Residual Dx and non dominant RCA disease, normal LVF  Hyperlipidemia Zocor 20 mg changed to Crestor 40 mg, will check f/u lipids and LFTs in 5 weeks  Essential hypertension B/P reading from home reviewed 118/78  Family history of premature CAD Father had 3 MIs, died of an MI at 5838   PLAN  Same Rx. I told him he can resume his walking as tolerated. I think his Lt RA site is just bruised and should resolve. F/U labs in a few weeks and then an OV with Dr Arlester Markerandolph-3 months.   Corine ShelterLuke Tiffane Sheldon PA-C 08/23/2017 8:39 AM

## 2017-08-23 NOTE — Assessment & Plan Note (Signed)
Father had 3 MIs, died of an MI at 6038

## 2017-08-30 ENCOUNTER — Telehealth (HOSPITAL_COMMUNITY): Payer: Self-pay

## 2017-08-30 NOTE — Telephone Encounter (Signed)
Called and spoke with patient to follow up with him in regards to stopping by the facility before beginning to schedule. Patient stated he unexpectedly went out of town and will be back over the weekend. He needs to speak with his employer - Will follow up next week.

## 2017-08-31 ENCOUNTER — Other Ambulatory Visit: Payer: Self-pay | Admitting: Cardiovascular Disease

## 2017-08-31 MED ORDER — ENALAPRIL MALEATE 20 MG PO TABS: 20.0000 mg | ORAL_TABLET | Freq: Every day | ORAL | 2 refills | Status: DC

## 2017-08-31 NOTE — Telephone Encounter (Signed)
Pt calling requesting a refill on enalapril 20 mg tablet. Pt would like for it to be sent to [facility]. This is Dr. Leonides Sakeandolph's pt. Please address

## 2017-09-06 ENCOUNTER — Telehealth (HOSPITAL_COMMUNITY): Payer: Self-pay

## 2017-09-06 NOTE — Telephone Encounter (Signed)
Called to follow up with patient in regards to Cardiac Rehab - Patient stated he will be by tomorrow morning at 10:00am.

## 2017-09-07 ENCOUNTER — Telehealth (HOSPITAL_COMMUNITY): Payer: Self-pay

## 2017-09-07 NOTE — Telephone Encounter (Signed)
Patient stopped by the facility and stated this program is something he is not interested in doing. Closed referral.

## 2017-09-10 ENCOUNTER — Encounter: Payer: Self-pay | Admitting: *Deleted

## 2017-09-10 DIAGNOSIS — Z006 Encounter for examination for normal comparison and control in clinical research program: Secondary | ICD-10-CM

## 2017-09-10 NOTE — Progress Notes (Signed)
[employer] 30 day telephone follow up completed. Patient denies any adverse events or changes in medication.

## 2017-09-26 ENCOUNTER — Telehealth: Payer: Self-pay

## 2017-09-26 NOTE — Telephone Encounter (Signed)
Spoke with patient and informed him that it was time for him to have follow up labs this week. Patient voiced understanding. Labs ordered in chart.

## 2017-10-15 LAB — BASIC METABOLIC PANEL
BUN/Creatinine Ratio: 12 (ref 9–20)
BUN: 16 mg/dL (ref 6–24)
CO2: 26 mmol/L (ref 20–29)
Calcium: 9.4 mg/dL (ref 8.7–10.2)
Chloride: 97 mmol/L (ref 96–106)
Creatinine, Ser: 1.37 mg/dL — ABNORMAL HIGH (ref 0.76–1.27)
GFR calc Af Amer: 65 mL/min/{1.73_m2} (ref >59)
GFR calc non Af Amer: 56 mL/min/{1.73_m2} — ABNORMAL LOW (ref >59)
Glucose: 101 mg/dL — ABNORMAL HIGH (ref 65–99)
Potassium: 3.7 mmol/L (ref 3.5–5.2)
Sodium: 138 mmol/L (ref 134–144)

## 2017-10-15 LAB — LIPID PANEL W/O CHOL/HDL RATIO
Cholesterol, Total: 168 mg/dL (ref 100–199)
HDL: 52 mg/dL (ref >39)
LDL Calculated: 84 mg/dL (ref 0–99)
Triglycerides: 162 mg/dL — ABNORMAL HIGH (ref 0–149)
VLDL Cholesterol Cal: 32 mg/dL (ref 5–40)

## 2017-10-15 LAB — HEPATIC FUNCTION PANEL
ALT: 23 IU/L (ref 0–44)
AST: 21 IU/L (ref 0–40)
Albumin: 4.3 g/dL (ref 3.5–5.5)
Alkaline Phosphatase: 78 IU/L (ref 39–117)
Bilirubin Total: 0.5 mg/dL (ref 0.0–1.2)
Bilirubin, Direct: 0.14 mg/dL (ref 0.00–0.40)
Total Protein: 7.2 g/dL (ref 6.0–8.5)

## 2017-10-23 ENCOUNTER — Ambulatory Visit: Payer: Managed Care, Other (non HMO) | Admitting: Cardiovascular Disease

## 2017-11-13 ENCOUNTER — Encounter: Payer: Self-pay | Admitting: Cardiovascular Disease

## 2017-11-13 ENCOUNTER — Ambulatory Visit: Payer: Managed Care, Other (non HMO) | Admitting: Cardiovascular Disease

## 2017-11-13 VITALS — BP 116/80 | HR 80 | Ht 71.0 in | Wt 214.6 lb

## 2017-11-13 DIAGNOSIS — Z9861 Coronary angioplasty status: Secondary | ICD-10-CM

## 2017-11-13 DIAGNOSIS — I2510 Atherosclerotic heart disease of native coronary artery without angina pectoris: Secondary | ICD-10-CM

## 2017-11-13 DIAGNOSIS — I10 Essential (primary) hypertension: Secondary | ICD-10-CM

## 2017-11-13 DIAGNOSIS — I739 Peripheral vascular disease, unspecified: Secondary | ICD-10-CM

## 2017-11-13 DIAGNOSIS — E7800 Pure hypercholesterolemia, unspecified: Secondary | ICD-10-CM

## 2017-11-13 MED ORDER — ENALAPRIL MALEATE 20 MG PO TABS: 20.0000 mg | ORAL_TABLET | Freq: Every day | ORAL | 3 refills | Status: AC

## 2017-11-13 NOTE — Patient Instructions (Signed)
Medication Instructions:  Your physician recommends that you continue on your current medications as directed. Please refer to the Current Medication list given to you today.  Labwork: NONE  Testing/Procedures: Your physician has requested that you have an ankle brachial index (ABI). During this test an ultrasound and blood pressure cuff are used to evaluate the arteries that supply the arms and legs with blood. Allow thirty minutes for this exam. There are no restrictions or special instructions.  Follow-Up: Your physician wants you to follow-up in: 4 MONTH OV  You will receive a reminder letter in the mail two months in advance. If you don't receive a letter, please call our office to schedule the follow-up appointment.  If you need a refill on your cardiac medications before your next appointment, please call your pharmacy.

## 2017-11-13 NOTE — Progress Notes (Signed)
Cardiology Office Note   Date:  11/13/2017   ID:  Shaun [name], DOB 1961/08/22, MRN [id number]  PCP:  Frederico Hamman, MD  Cardiologist:   Chilton Si, MD   No chief complaint on file.    History of Present Illness: Shaun [name] is a 57 y.o. male with CAD, hypertension and hyperlipidemia who presents for follow up.  He was initially seen 07/2017 for an evaluation of chest pain.  He was seen in the ED 9/29 with two days of pleuritic chest pain that improved with walking.  He also reported indigestion.  EKG showed sinus tachycardia at 123 bpm and no ischemia.  Cardiac enzymes were negative x2.   He was seen in clinic 06/2017 and referred for a coronary CT-A that revealed 40-50% distal left main disease.  FFR of the mid LAD was 0.71 and the distal left circumflex was 0.75, both consistent with ischemia.  His coronary calcium score was in the 98th percentile.   Shaun [name] father died of a heart attack at age 76.  After catheterization 09/06/2016 which revealed 60% mid RCA, 100% ostial L PDA, 40% ostial circumflex, 10% left main, percent D1, and 80% proximal to mid LAD.  A drug-eluting stent was placed in the LAD.  The small diagonal and LCx filled with right to left collaterals.  He followed up with Corine Shelter, PA-C on 08/2017 and is doing well.  Since his last appointment Shaun [name] has been feeling well.  He has no chest pain or shortness of breath.  He tries to walk for exercise 5 or 6 days/week.  He walks about 1-1/2 miles for 25 minutes.  He notes that since his heart catheterization cilostazol was discontinued and he notes more pain in his legs when walking.  He has to take his time.  He denies lower extremity edema, orthopnea, or PND.  He also has not had any palpitations, lightheadedness, or dizziness.  He is working on his diet and trying to limit fried foods, fatty foods, and red meats.   Past Medical History:  Diagnosis Date  . Atypical chest pain 06/05/2017  . CAD in native  artery 07/19/2017   12/18 PCI/DESx1 to mLAD, residual disease in LM, with CTO of dLcx  . Essential hypertension 06/05/2017  . Family history of premature CAD 06/05/2017  . Hyperlipidemia with target low density lipoprotein (LDL) cholesterol less than 100 mg/dL   . Hypertension     Past Surgical History:  Procedure Laterality Date  . CORONARY ANGIOPLASTY WITH STENT PLACEMENT    . CORONARY STENT INTERVENTION N/A 08/06/2017   Procedure: CORONARY STENT INTERVENTION;  Surgeon: Kathleene Hazel, MD;  Location: [facility];  Service: Cardiovascular;  Laterality: N/A;  . LEFT HEART CATH AND CORONARY ANGIOGRAPHY N/A 08/06/2017   Procedure: LEFT HEART CATH AND CORONARY ANGIOGRAPHY;  Surgeon: Kathleene Hazel, MD;  Location: [facility];  Service: Cardiovascular;  Laterality: N/A;  . PERIPHERAL VASCULAR INTERVENTION Right    "balloon"  . TONSILLECTOMY       Current Outpatient Medications  Medication Sig Dispense Refill  . aspirin EC 81 MG tablet Take 81 mg by mouth daily.    . Brinzolamide-Brimonidine (SIMBRINZA) 1-0.2 % SUSP Apply to eye 2 (two) times daily.    . clopidogrel (PLAVIX) 75 MG tablet Take 1 tablet (75 mg total) by mouth daily with breakfast. 90 tablet 3  . enalapril (VASOTEC) 20 MG tablet Take 1 tablet (20 mg total) by mouth daily.  90 tablet 3  . hydrochlorothiazide (HYDRODIURIL) 25 MG tablet Take 1 tablet (25 mg total) by mouth daily. 90 tablet 3  . metoprolol succinate (TOPROL-XL) 100 MG 24 hr tablet Take 1 tablet (100 mg total) by mouth daily. 90 tablet 3  . Multiple Vitamin (MULTI-VITAMINS) TABS Take 1 tablet by mouth daily.    Marland Kitchen. NIFEdipine (PROCARDIA-XL/ADALAT CC) 60 MG 24 hr tablet Take 60 mg by mouth daily.    . potassium chloride (K-DUR) 10 MEQ tablet Take 2 tablets (20 mEq total) 2 (two) times daily by mouth. 360 tablet 3  . rosuvastatin (CRESTOR) 40 MG tablet Take 1 tablet (40 mg total) daily by mouth. 90 tablet 3   No current  facility-administered medications for this visit.     Allergies:   Latex    Social History:  The patient  reports that he has quit smoking. His smoking use included cigarettes. He has a 15.00 pack-year smoking history. he has never used smokeless tobacco. He reports that he drinks about 2.4 oz of alcohol per week. He reports that he uses drugs. Drug: Marijuana.   Family History:  The patient's family history includes CAD in his father; Heart attack in his father, paternal grandmother, and paternal uncle; Stroke in his brother.    ROS:  Please see the history of present illness.   Otherwise, review of systems are positive for none.   All other systems are reviewed and negative.    PHYSICAL EXAM: VS:  BP 116/80   Pulse 80   Ht 5\' 11"  (1.803 m)   Wt 214 lb 9.6 oz (97.3 kg)   BMI 29.93 kg/m  , BMI Body mass index is 29.93 kg/m. GENERAL:  Well appearing HEENT: Pupils equal round and reactive, fundi not visualized, oral mucosa unremarkable NECK:  No jugular venous distention, waveform within normal limits, carotid upstroke brisk and symmetric, no bruits LUNGS:  Clear to auscultation bilaterally HEART:  RRR.  PMI not displaced or sustained,S1 and S2 within normal limits, no S3, no S4, no clicks, no rubs, no murmurs ABD:  Flat, positive bowel sounds normal in frequency in pitch, no bruits, no rebound, no guarding, no midline pulsatile mass, no hepatomegaly, no splenomegaly EXT: Unable to palpate radial or TP pulses bilaterally.  2+ DP pulses bilaterally.  no edema, no cyanosis no clubbing SKIN:  No rashes no nodules NEURO:  Cranial nerves II through XII grossly intact, motor grossly intact throughout PSYCH:  Cognitively intact, oriented to person place and time   EKG:  EKG is not ordered today. 06/02/17: Sinus tachycardia.  Rate 123 bpm.  Coronary CT-A 07/11/17: IMPRESSION: 1.  40-50% distal left main stenosis. 2.  Possible moderate mid LAD stenosis. 3. Coronary artery calcium score  628 Agatston units, placing the patient in the 98th percentile for age and gender. This suggests high risk for future cardiac events. FFR 0.71 in the mid LAD, suggesting a hemodynamically significant stenosis. FFR 0.75 in the distal LCx, suggesting a hemodynamically significant Stenosis.  LHC 08/06/17:  Mid RCA lesion is 60% stenosed.  Ost LPDA lesion is 100% stenosed.  Ost Cx lesion is 40% stenosed.  Ost LM to Dist LM lesion is 10% stenosed.  Ost 1st Diag lesion is 100% stenosed.  Prox LAD to Mid LAD lesion is 80% stenosed.  A drug-eluting stent was successfully placed using a STENT PROMUS PREM MR 4.0X16.  Post intervention, there is a 0% residual stenosis.  Recent Labs: 08/07/2017: Hemoglobin 12.1; Platelets 204 10/15/2017: ALT 23;  BUN 16; Creatinine, Ser 1.37; Potassium 3.7; Sodium 138   01/02/17: Total cholesterol 212, triglycerides 150, HDL 52, LDL 129   Lipid Panel    Component Value Date/Time   CHOL 168 10/15/2017 1035   TRIG 162 (H) 10/15/2017 1035   HDL 52 10/15/2017 1035   LDLCALC 84 10/15/2017 1035      Wt Readings from Last 3 Encounters:  11/13/17 214 lb 9.6 oz (97.3 kg)  08/23/17 212 lb 3.2 oz (96.3 kg)  08/07/17 214 lb 8.1 oz (97.3 kg)      ASSESSMENT AND PLAN:  # CAD:  # Atypical chest pain: # Family history of premature CAD: # Hyperlipidemia: DES placed in the LAD 08/2017.  Continue DAPT at least one year.  He had other 100% occlusions with collateral flow, though he doesn't recall having MI.  Simvastatin was switched to rosuvastatin.  His LDL remains above goal.  He wants to continue working on diet and exercise.  If his LDL is greater than 70 at follow-up will need to either Zetia or a PCSK9 inhibitor.  # Hypertension: Blood pressure is well controlled o enalapril, hydrochlorothiazide, metoprolol, andn nifedipine.  # PAD: Cilostazol was stopped after PCI and he has more claudication.  Repeat ABIs.  Continue meds as above.   Current medicines  are reviewed at length with the patient today.  The patient does not have concerns regarding medicines.  The following changes have been made:  no change  Labs/ tests ordered today include:   No orders of the defined types were placed in this encounter.    Disposition:   FU with Kallum [name] C. Duke Salvia, MD, [facility] in 4 months    This note was written with the assistance of speech recognition software.  Please excuse any transcriptional errors.  Signed, Niklas Chretien C. Duke Salvia, MD, [facility]  11/13/2017 8:43 AM    [facility]

## 2017-11-14 NOTE — Research (Signed)
OPTIMIZE Protocol Labs  Component     Latest Ref Rng & Units 08/06/2017 08/06/2017 08/07/2017        11:45 AM Pre PCI  4:25 PM Post PCI 4-12 05:14 Post PCI 12-20  Troponin I     <0.03 ng/mL <0.03 <0.03 0.04 (HH)

## 2017-11-23 ENCOUNTER — Ambulatory Visit (HOSPITAL_COMMUNITY)
Admission: RE | Admit: 2017-11-23 | Discharge: 2017-11-23 | Disposition: A | Discharge status: Home / Self Care | Payer: Managed Care, Other (non HMO) | Source: Ambulatory Visit | Attending: Cardiovascular Disease | Admitting: Cardiovascular Disease

## 2017-11-23 ENCOUNTER — Inpatient Hospital Stay (HOSPITAL_COMMUNITY)
Admission: RE | Admit: 2017-11-23 | Payer: Managed Care, Other (non HMO) | Source: Ambulatory Visit | Attending: Cardiovascular Disease | Admitting: Cardiovascular Disease

## 2017-11-23 ENCOUNTER — Telehealth: Payer: Self-pay

## 2017-11-23 DIAGNOSIS — I739 Peripheral vascular disease, unspecified: Secondary | ICD-10-CM

## 2017-11-23 NOTE — Telephone Encounter (Signed)
Annual ABI due 11-2018 order entered

## 2017-11-28 ENCOUNTER — Encounter: Payer: Self-pay | Admitting: *Deleted

## 2018-01-08 ENCOUNTER — Encounter: Payer: Self-pay | Admitting: *Deleted

## 2018-01-08 DIAGNOSIS — Z006 Encounter for examination for normal comparison and control in clinical research program: Secondary | ICD-10-CM

## 2018-01-08 NOTE — Progress Notes (Signed)
OPTIMIZE Research study month 6 telephone follow up completed. Patient denies any adverse events and denies Chest pain or any changes in his medication. I thanked him for his participation and next required follow up will be due between 29/oct/219 and 28/dec/2019 an EKG will be needed.

## 2018-02-08 ENCOUNTER — Other Ambulatory Visit: Payer: Self-pay

## 2018-02-08 DIAGNOSIS — I739 Peripheral vascular disease, unspecified: Secondary | ICD-10-CM

## 2018-04-09 ENCOUNTER — Other Ambulatory Visit: Payer: Self-pay | Admitting: Cardiovascular Disease

## 2018-04-29 ENCOUNTER — Ambulatory Visit: Payer: Managed Care, Other (non HMO) | Admitting: Cardiovascular Disease

## 2018-04-29 ENCOUNTER — Encounter: Payer: Self-pay | Admitting: Cardiovascular Disease

## 2018-04-29 VITALS — BP 136/82 | HR 78 | Ht 71.0 in | Wt 214.4 lb

## 2018-04-29 DIAGNOSIS — I10 Essential (primary) hypertension: Secondary | ICD-10-CM

## 2018-04-29 DIAGNOSIS — E7800 Pure hypercholesterolemia, unspecified: Secondary | ICD-10-CM

## 2018-04-29 DIAGNOSIS — R0789 Other chest pain: Secondary | ICD-10-CM | POA: Diagnosis not present

## 2018-04-29 DIAGNOSIS — I2510 Atherosclerotic heart disease of native coronary artery without angina pectoris: Secondary | ICD-10-CM

## 2018-04-29 MED ORDER — EZETIMIBE 10 MG PO TABS: 10.0000 mg | ORAL_TABLET | Freq: Every day | ORAL | 3 refills | Status: DC

## 2018-04-29 NOTE — Progress Notes (Signed)
Cardiology Office Note   Date:  04/29/2018   ID:  Shaun [name], DOB Jan 24, 1961, MRN [id number]  PCP:  Frederico Hamman, MD  Cardiologist:   Chilton Si, MD   No chief complaint on file.    History of Present Illness: Shaun [name] is a 57 y.o. male with CAD, hypertension and hyperlipidemia who presents for follow up.  He was initially seen 07/2017 for an evaluation of chest pain.  He was seen in the ED 05/2017 with two days of pleuritic chest pain that improved with walking.  He also reported indigestion.  EKG showed sinus tachycardia at 123 bpm and no ischemia.  Cardiac enzymes were negative x2.   He was seen in clinic 06/2017 and referred for a coronary CT-A that revealed 40-50% distal left main disease.  FFR of the mid LAD was 0.71 and the distal left circumflex was 0.75, both consistent with ischemia.  His coronary calcium score was in the 98th percentile.   Shaun [name] father died of a heart attack at age 81.  After catheterization 09/06/2016 which revealed 60% mid RCA, 100% ostial L PDA, 40% ostial circumflex, 10% left main, percent D1, and 80% proximal to mid LAD.  A drug-eluting stent was placed in the LAD.  The small diagonal and LCx filled with right to left collaterals.  He followed up with Corine Shelter, PA-C on 08/2017 and is doing well.  Since his last appointment Shaun [name] has been feeling well.  He and his girlfriend enjoy hiking in the summer.  He does not do this regularly.  However in the winter he is more regimented with his exercise.  He has no exertional chest pain or shortness of breath.  He has not noticed any lower extremity edema, orthopnea, or PND.  He checks his blood pressure at home and it typically is in the 120s over 80s.  He has not noted any elevated blood pressures.  At his last appointment his LDL was above goal.  We recommended that he work on diet and exercise and consider adding either Zetia or a PCSK9 inhibitor at follow-up.  He reports that he had  fasting lipids checked with his PCP 01/2018.    Past Medical History:  Diagnosis Date  . Atypical chest pain 06/05/2017  . CAD in native artery 07/19/2017   12/18 PCI/DESx1 to mLAD, residual disease in LM, with CTO of dLcx  . Essential hypertension 06/05/2017  . Family history of premature CAD 06/05/2017  . Hyperlipidemia with target low density lipoprotein (LDL) cholesterol less than 100 mg/dL   . Hypertension     Past Surgical History:  Procedure Laterality Date  . CORONARY ANGIOPLASTY WITH STENT PLACEMENT    . CORONARY STENT INTERVENTION N/A 08/06/2017   Procedure: CORONARY STENT INTERVENTION;  Surgeon: Kathleene Hazel, MD;  Location: [facility];  Service: Cardiovascular;  Laterality: N/A;  . LEFT HEART CATH AND CORONARY ANGIOGRAPHY N/A 08/06/2017   Procedure: LEFT HEART CATH AND CORONARY ANGIOGRAPHY;  Surgeon: Kathleene Hazel, MD;  Location: [facility];  Service: Cardiovascular;  Laterality: N/A;  . PERIPHERAL VASCULAR INTERVENTION Right    "balloon"  . TONSILLECTOMY       Current Outpatient Medications  Medication Sig Dispense Refill  . aspirin EC 81 MG tablet Take 81 mg by mouth daily.    . Brinzolamide-Brimonidine (SIMBRINZA) 1-0.2 % SUSP Apply 1 drop to eye 2 (two) times daily.     . clopidogrel (PLAVIX) 75 MG tablet  Take 1 tablet (75 mg total) by mouth daily with breakfast. 90 tablet 3  . enalapril (VASOTEC) 20 MG tablet Take 1 tablet (20 mg total) by mouth daily. 90 tablet 3  . hydrochlorothiazide (HYDRODIURIL) 25 MG tablet Take 1 tablet (25 mg total) by mouth daily. 90 tablet 3  . metoprolol succinate (TOPROL-XL) 100 MG 24 hr tablet Take 1 tablet (100 mg total) by mouth daily. 90 tablet 3  . Multiple Vitamin (MULTI-VITAMINS) TABS Take 1 tablet by mouth daily.    Marland Kitchen NIFEdipine (PROCARDIA-XL/ADALAT CC) 60 MG 24 hr tablet Take 60 mg by mouth daily.    . potassium chloride (K-DUR) 10 MEQ tablet Take 2 tablets (20 mEq total) 2 (two) times daily by  mouth. 360 tablet 3  . rosuvastatin (CRESTOR) 40 MG tablet TAKE 1 TABLET BY MOUTH ONCE A DAY 90 tablet 3  . ezetimibe (ZETIA) 10 MG tablet Take 1 tablet (10 mg total) by mouth daily. 90 tablet 3   No current facility-administered medications for this visit.     Allergies:   Latex    Social History:  The patient  reports that he has quit smoking. His smoking use included cigarettes. He has a 15.00 pack-year smoking history. He has never used smokeless tobacco. He reports that he drinks about 4.0 standard drinks of alcohol per week. He reports that he has current or past drug history. Drug: Marijuana.   Family History:  The patient's family history includes CAD in his father; Heart attack in his father, paternal grandmother, and paternal uncle; Stroke in his brother.    ROS:  Please see the history of present illness.   Otherwise, review of systems are positive for none.   All other systems are reviewed and negative.    PHYSICAL EXAM: VS:  BP 136/82   Pulse 78   Ht 5\' 11"  (1.803 m)   Wt 214 lb 6.4 oz (97.3 kg)   BMI 29.90 kg/m  , BMI Body mass index is 29.9 kg/m. GENERAL:  Well appearing HEENT: Pupils equal round and reactive, fundi not visualized, oral mucosa unremarkable NECK:  No jugular venous distention, waveform within normal limits, carotid upstroke brisk and symmetric, no bruits LUNGS:  Clear to auscultation bilaterally HEART:  RRR.  PMI not displaced or sustained,S1 and S2 within normal limits, no S3, no S4, no clicks, no rubs, no murmurs ABD:  Flat, positive bowel sounds normal in frequency in pitch, no bruits, no rebound, no guarding, no midline pulsatile mass, no hepatomegaly, no splenomegaly EXT:  2 plus pulses throughout, no edema, no cyanosis no clubbing SKIN:  No rashes no nodules NEURO:  Cranial nerves II through XII grossly intact, motor grossly intact throughout PSYCH:  Cognitively intact, oriented to person place and time   EKG:  EKG is ordered today. 06/02/17:  Sinus tachycardia.  Rate 123 bpm. 04/29/18: Sinus rhythm.  Rate 78 bpm.    Coronary CT-A 07/11/17: IMPRESSION: 1.  40-50% distal left main stenosis. 2.  Possible moderate mid LAD stenosis. 3. Coronary artery calcium score 628 Agatston units, placing the patient in the 98th percentile for age and gender. This suggests high risk for future cardiac events. FFR 0.71 in the mid LAD, suggesting a hemodynamically significant stenosis. FFR 0.75 in the distal LCx, suggesting a hemodynamically significant Stenosis.  LHC 08/06/17:  Mid RCA lesion is 60% stenosed.  Ost LPDA lesion is 100% stenosed.  Ost Cx lesion is 40% stenosed.  Ost LM to Dist LM lesion is 10% stenosed.  Ost 1st Diag lesion is 100% stenosed.  Prox LAD to Mid LAD lesion is 80% stenosed.  A drug-eluting stent was successfully placed using a STENT PROMUS PREM MR 4.0X16.  Post intervention, there is a 0% residual stenosis.  Recent Labs: 08/07/2017: Hemoglobin 12.1; Platelets 204 10/15/2017: ALT 23; BUN 16; Creatinine, Ser 1.37; Potassium 3.7; Sodium 138   01/02/17: Total cholesterol 212, triglycerides 150, HDL 52, LDL 129   Lipid Panel    Component Value Date/Time   CHOL 168 10/15/2017 1035   TRIG 162 (H) 10/15/2017 1035   HDL 52 10/15/2017 1035   LDLCALC 84 10/15/2017 1035      Wt Readings from Last 3 Encounters:  04/29/18 214 lb 6.4 oz (97.3 kg)  11/13/17 214 lb 9.6 oz (97.3 kg)  08/23/17 212 lb 3.2 oz (96.3 kg)      ASSESSMENT AND PLAN:  # CAD:  # Atypical chest pain: # Family history of premature CAD: # Hyperlipidemia: DES placed in the LAD 08/2017.  He had other 100% occlusions with collateral flow, though he doesn't recall having MI.  We will stop clopidogrel 09/2017.   Simvastatin was switched to rosuvastatin.  We will get a copy of his lipids from his PCP.  If his LDL remains greater than 70 then we will add Zetia or PCSK9 inhibitor.  # Hypertension: Blood pressure is above goal today both  initially and on repeat.  He should be less than 130/80.  He thinks it is been well-controlled at home.  I have asked him to track his blood pressures daily and bring it to follow-up with our pharmacist in 1 month.  Continue enalapril, hydrochlorothiazide, metoprolol, and nifedipine.  # PAD: Cilostazol was stopped after PCI and he has more claudication.  Continue meds as above.   Current medicines are reviewed at length with the patient today.  The patient does not have concerns regarding medicines.  The following changes have been made:  no change  Labs/ tests ordered today include:   Orders Placed This Encounter  Procedures  . EKG 12-Lead     Disposition:   FU with Shaun Everly C. Duke Salviaandolph, MD, [facility] in 6 months     Signed, Shaun Demore C. Duke Salviaandolph, MD, [facility]  04/29/2018 11:25 AM    [facility]

## 2018-04-29 NOTE — Patient Instructions (Addendum)
Medication Instructions:  START ZETIA 10 MG DAILY   Labwork: none  Testing/Procedures: none  Follow-Up: Your physician recommends that you schedule a follow-up appointment in: 1 month with Pharm D   Your physician wants you to follow-up in: 6 MONTHS [facility] You will receive a reminder letter in the mail two months in advance. If you don't receive a letter, please call our office to schedule the follow-up appointment.  Any Other Special Instructions Will Be Listed Below (If Applicable).  MONITOR YOUR BLOOD PRESSURE HOME. IF IT REMAINS BELOW 130/80 YOU MAY CANCEL YOUR FOLLOW UP WITH THE PHARM D   If you need a refill on your cardiac medications before your next appointment, please call your pharmacy.

## 2018-05-30 ENCOUNTER — Ambulatory Visit: Payer: Managed Care, Other (non HMO)

## 2018-07-15 ENCOUNTER — Telehealth: Payer: Self-pay

## 2018-07-15 NOTE — Telephone Encounter (Signed)
Called pt to schedule for the f/u appt for the Optimize study. Left voicemail.

## 2018-07-24 ENCOUNTER — Encounter: Payer: Managed Care, Other (non HMO) | Admitting: *Deleted

## 2018-07-24 VITALS — BP 170/90 | HR 71 | Wt 213.2 lb

## 2018-07-24 DIAGNOSIS — Z006 Encounter for examination for normal comparison and control in clinical research program: Secondary | ICD-10-CM

## 2018-07-24 NOTE — Research (Signed)
OPTIMIZE Month 12 follow up visit completed. Patient denies any chest pain or other Adverse events. Dr Lia Foyer met with patient and took vital signs. BP 170/90. EKG Obtained.

## 2018-08-14 ENCOUNTER — Other Ambulatory Visit: Payer: Self-pay | Admitting: Cardiovascular Disease

## 2018-08-14 ENCOUNTER — Other Ambulatory Visit: Payer: Self-pay | Admitting: Cardiology

## 2018-11-26 ENCOUNTER — Ambulatory Visit (HOSPITAL_COMMUNITY): Payer: Managed Care, Other (non HMO)

## 2018-12-26 ENCOUNTER — Encounter (HOSPITAL_COMMUNITY): Payer: Managed Care, Other (non HMO)

## 2019-01-22 ENCOUNTER — Inpatient Hospital Stay (HOSPITAL_COMMUNITY): Admission: RE | Admit: 2019-01-22 | Payer: Managed Care, Other (non HMO) | Source: Ambulatory Visit

## 2019-02-13 ENCOUNTER — Other Ambulatory Visit: Payer: Self-pay

## 2019-02-13 ENCOUNTER — Ambulatory Visit (HOSPITAL_COMMUNITY)
Admission: RE | Admit: 2019-02-13 | Discharge: 2019-02-13 | Disposition: A | Discharge status: Home / Self Care | Payer: Managed Care, Other (non HMO) | Source: Ambulatory Visit | Attending: Cardiology | Admitting: Cardiology

## 2019-02-13 DIAGNOSIS — I739 Peripheral vascular disease, unspecified: Secondary | ICD-10-CM | POA: Insufficient documentation

## 2019-03-03 ENCOUNTER — Telehealth: Payer: Self-pay | Admitting: *Deleted

## 2019-03-03 NOTE — Telephone Encounter (Signed)
-----   Message from Skeet Latch, MD sent at 02/23/2019  2:48 PM EDT ----- Mild PAD on R and normal on left.  He is due for fasting lipids.  Clopidogrel should have been stopped.  Otherwise no changes.

## 2019-03-03 NOTE — Telephone Encounter (Signed)
-----   Message from Elouise Munroe, MD sent at 02/25/2019  3:35 PM EDT ----- Per interpreting physician, a vascular consult is recommended. Please arrange with Dr. Gwenlyn Found or Dr. Fletcher Anon.   Study suggests abnormal flow in right leg due to moderate and severe blockages.

## 2019-03-03 NOTE — Telephone Encounter (Signed)
Advised patient and scheduled appointment tomorrow with Dr Fletcher Anon      COVID-19 Pre-Screening Questions:  . In the past 7 to 10 days have you had a cough,  shortness of breath, headache, congestion, fever (100 or greater) body aches, chills, sore throat, or sudden loss of taste or sense of smell? . Have you been around anyone with known Covid 19. . Have you been around anyone who is awaiting Covid 19 test results in the past 7 to 10 days? . Have you been around anyone who has been exposed to Covid 19, or has mentioned symptoms of Covid 19 within the past 7 to 10 days?  If you have any concerns/questions about symptoms patients report during screening (either on the phone or at threshold). Contact the provider seeing the patient or DOD for further guidance.  If neither are available contact a member of the leadership team.      Patient answered no to all of the above questions

## 2019-03-04 ENCOUNTER — Ambulatory Visit: Payer: Managed Care, Other (non HMO) | Admitting: Cardiovascular Disease

## 2019-03-04 ENCOUNTER — Other Ambulatory Visit: Payer: Self-pay

## 2019-03-04 ENCOUNTER — Encounter: Payer: Self-pay | Admitting: Cardiovascular Disease

## 2019-03-04 VITALS — BP 132/78 | HR 72 | Ht 71.0 in | Wt 215.0 lb

## 2019-03-04 DIAGNOSIS — I2510 Atherosclerotic heart disease of native coronary artery without angina pectoris: Secondary | ICD-10-CM | POA: Diagnosis not present

## 2019-03-04 DIAGNOSIS — E785 Hyperlipidemia, unspecified: Secondary | ICD-10-CM | POA: Diagnosis not present

## 2019-03-04 DIAGNOSIS — I10 Essential (primary) hypertension: Secondary | ICD-10-CM | POA: Diagnosis not present

## 2019-03-04 DIAGNOSIS — I739 Peripheral vascular disease, unspecified: Secondary | ICD-10-CM | POA: Diagnosis not present

## 2019-03-04 NOTE — Patient Instructions (Signed)
Medication Instructions:  Your physician recommends that you continue on your current medications as directed. Please refer to the Current Medication list given to you today.  If you need a refill on your cardiac medications before your next appointment, please call your pharmacy.    Follow-Up: At [facility], you and your health needs are our priority.  As part of our continuing mission to provide you with exceptional heart care, we have created designated Provider Care Teams.  These Care Teams include your primary Cardiologist (physician) and Advanced Practice Providers (APPs -  Physician Assistants and Nurse Practitioners) who all work together to provide you with the care you need, when you need it. You will need a follow up appointment in 1 year.  Please call our office 2 months in advance to schedule this appointment.  You may see Marlyne Beards, MD or one of the following Advanced Practice Providers on your designated Care Team:   Kerin Ransom, PA-C Roby Lofts, [place] . Sande Rives, PA-C  Any Other Special Instructions Will Be Listed Below (If Applicable). None

## 2019-03-04 NOTE — Progress Notes (Signed)
Cardiology Office Note   Date:  03/04/2019   ID:  Shaun [name] Mance, DOB 1961/05/10, MRN [id number]  PCP:  Frederico HammanMiller, Carlton D, MD  Cardiologist: Dr. Duke Salviaandolph  No chief complaint on file.     History of Present Illness: Shaun [name] Ishee is a 58 y.o. male who was referred by Dr. Duke Salviaandolph for evaluation and management of peripheral arterial disease. He has known history of coronary artery disease status post stenting, essential hypertension, hyperlipidemia, peripheral arterial disease and previous tobacco use. He has known family history of premature coronary artery disease.  He has known history of peripheral arterial disease [facility].  He remembers having balloon angioplasty done.  He was on cilostazol for years but that was discontinued when he went on Plavix.  He currently reports no leg claudication in spite of walking 3 miles daily.  He has no lower extremity ulceration.  He had recent vascular studies done earlier this month which showed mildly reduced ABI on the right side with evidence of borderline significant SFA disease.    Past Medical History:  Diagnosis Date  . Atypical chest pain 06/05/2017  . CAD in native artery 07/19/2017   12/18 PCI/DESx1 to mLAD, residual disease in LM, with CTO of dLcx  . Essential hypertension 06/05/2017  . Family history of premature CAD 06/05/2017  . Hyperlipidemia with target low density lipoprotein (LDL) cholesterol less than 100 mg/dL   . Hypertension     Past Surgical History:  Procedure Laterality Date  . CORONARY ANGIOPLASTY WITH STENT PLACEMENT    . CORONARY STENT INTERVENTION N/A 08/06/2017   Procedure: CORONARY STENT INTERVENTION;  Surgeon: Kathleene HazelMcAlhany, Christopher D, MD;  Location: [facility] INVASIVE CV [facility];  Service: Cardiovascular;  Laterality: N/A;  . LEFT HEART CATH AND CORONARY ANGIOGRAPHY N/A 08/06/2017   Procedure: LEFT HEART CATH AND CORONARY ANGIOGRAPHY;  Surgeon: Kathleene HazelMcAlhany, Christopher D, MD;  Location: [facility] INVASIVE CV  LAB;  Service: Cardiovascular;  Laterality: N/A;  . PERIPHERAL VASCULAR INTERVENTION Right    "balloon"  . TONSILLECTOMY       Current Outpatient Medications  Medication Sig Dispense Refill  . aspirin EC 81 MG tablet Take 81 mg by mouth daily.    . clopidogrel (PLAVIX) 75 MG tablet TAKE 1 TABLET (75 MG TOTAL) BY MOUTH DAILY WITH BREAKFAST. 90 tablet 0  . enalapril (VASOTEC) 20 MG tablet Take 1 tablet (20 mg total) by mouth daily. 90 tablet 3  . ezetimibe (ZETIA) 10 MG tablet Take 1 tablet (10 mg total) by mouth daily. 90 tablet 3  . hydrochlorothiazide (HYDRODIURIL) 25 MG tablet TAKE 1 TABLET (25 MG TOTAL) BY MOUTH DAILY. 90 tablet 0  . metoprolol succinate (TOPROL-XL) 100 MG 24 hr tablet TAKE 1 TABLET (100 MG TOTAL) BY MOUTH DAILY. 90 tablet 0  . Multiple Vitamin (MULTI-VITAMINS) TABS Take 1 tablet by mouth daily.    Marland Kitchen. NIFEdipine (PROCARDIA-XL/ADALAT CC) 60 MG 24 hr tablet Take 60 mg by mouth daily.    . potassium chloride (K-DUR) 10 MEQ tablet TAKE 2 TABLETS BY MOUTH TWICE A DAY 360 tablet 0  . rosuvastatin (CRESTOR) 40 MG tablet TAKE 1 TABLET BY MOUTH ONCE A DAY 90 tablet 3   No current facility-administered medications for this visit.     Allergies:   Latex    Social History:  The patient  reports that he has quit smoking. His smoking use included cigarettes. He has a 15.00 pack-year smoking history. He has never used smokeless tobacco. He reports current  alcohol use of about 4.0 standard drinks of alcohol per week. He reports current drug use. Drug: Marijuana.   Family History:  The patient's family history includes CAD in his father; Heart attack in his father, paternal grandmother, and paternal uncle; Stroke in his brother.    ROS:  Please see the history of present illness.   Otherwise, review of systems are positive for none.   All other systems are reviewed and negative.    PHYSICAL EXAM: VS:  BP 132/78   Pulse 72   Ht 5\' 11"  (1.803 m)   Wt 215 lb (97.5 kg)   BMI  29.99 kg/m  , BMI Body mass index is 29.99 kg/m. GEN: Well nourished, well developed, in no acute distress  HEENT: normal  Neck: no JVD, carotid bruits, or masses Cardiac: RRR; no murmurs, rubs, or gallops,no edema  Respiratory:  clear to auscultation bilaterally, normal work of breathing GI: soft, nontender, nondistended, + BS MS: no deformity or atrophy  Skin: warm and dry, no rash Neuro:  Strength and sensation are intact Psych: euthymic mood, full affect Vascular: Right radial pulse is absent.  Left radial pulses normal.  Distal pedal pulses are palpable on the left side but not the right.   EKG:  EKG is ordered today. The ekg ordered today demonstrates normal sinus rhythm with no significant ST or T wave changes.   Recent Labs: No results found for requested labs within last 8760 hours.    Lipid Panel    Component Value Date/Time   CHOL 168 10/15/2017 1035   TRIG 162 (H) 10/15/2017 1035   HDL 52 10/15/2017 1035   LDLCALC 84 10/15/2017 1035      Wt Readings from Last 3 Encounters:  03/04/19 215 lb (97.5 kg)  07/24/18 213 lb 3.2 oz (96.7 kg)  04/29/18 214 lb 6.4 oz (97.3 kg)      PAD Screen 03/04/2019  Previous PAD dx? No  Previous surgical procedure? No  Pain with walking? No  Feet/toe relief with dangling? No  Painful, non-healing ulcers? No  Extremities discolored? No      ASSESSMENT AND PLAN:  1.  Peripheral arterial disease with mildly reduced ABI on the right side with evidence of borderline significant SFA disease: Currently with no claudication or evidence of critical limb ischemia.  I discussed with him the natural history and management of claudication.  There is currently no indication for revascularization given the lack of symptoms.  I recommend aggressive treatment of risk factors and continued walking.  Follow-up with me on a yearly basis or earlier if symptoms worsen.  2.  Coronary artery disease involving native coronary arteries without  angina: Continue medical therapy.  3.  Essential hypertension: Blood pressure is controlled on current medications.  4.  Hyperlipidemia: Currently on high-dose rosuvastatin and Zetia.  Most recent LDL was 84.    Disposition:   FU with me in 1 year  Signed,  Kathlyn Sacramento, MD  03/04/2019 10:14 AM    [place]

## 2019-03-06 ENCOUNTER — Other Ambulatory Visit (INDEPENDENT_AMBULATORY_CARE_PROVIDER_SITE_OTHER): Payer: Managed Care, Other (non HMO)

## 2019-03-06 DIAGNOSIS — I2510 Atherosclerotic heart disease of native coronary artery without angina pectoris: Secondary | ICD-10-CM

## 2019-03-06 DIAGNOSIS — E785 Hyperlipidemia, unspecified: Secondary | ICD-10-CM

## 2019-03-06 DIAGNOSIS — I739 Peripheral vascular disease, unspecified: Secondary | ICD-10-CM

## 2019-03-06 DIAGNOSIS — I10 Essential (primary) hypertension: Secondary | ICD-10-CM | POA: Diagnosis not present

## 2019-03-06 DIAGNOSIS — Z9861 Coronary angioplasty status: Secondary | ICD-10-CM

## 2019-04-01 ENCOUNTER — Other Ambulatory Visit: Payer: Self-pay | Admitting: Cardiovascular Disease

## 2019-07-10 ENCOUNTER — Other Ambulatory Visit: Payer: Self-pay | Admitting: Cardiovascular Disease

## 2019-09-17 ENCOUNTER — Other Ambulatory Visit: Payer: Self-pay

## 2019-09-17 ENCOUNTER — Ambulatory Visit: Payer: Managed Care, Other (non HMO) | Admitting: Cardiovascular Disease

## 2019-09-17 ENCOUNTER — Encounter: Payer: Self-pay | Admitting: Cardiovascular Disease

## 2019-09-17 ENCOUNTER — Encounter (INDEPENDENT_AMBULATORY_CARE_PROVIDER_SITE_OTHER): Payer: Self-pay

## 2019-09-17 VITALS — BP 122/82 | HR 77 | Ht 71.0 in | Wt 211.0 lb

## 2019-09-17 DIAGNOSIS — E785 Hyperlipidemia, unspecified: Secondary | ICD-10-CM | POA: Diagnosis not present

## 2019-09-17 DIAGNOSIS — I739 Peripheral vascular disease, unspecified: Secondary | ICD-10-CM | POA: Diagnosis not present

## 2019-09-17 DIAGNOSIS — I10 Essential (primary) hypertension: Secondary | ICD-10-CM

## 2019-09-17 DIAGNOSIS — I2510 Atherosclerotic heart disease of native coronary artery without angina pectoris: Secondary | ICD-10-CM

## 2019-09-17 DIAGNOSIS — Z5181 Encounter for therapeutic drug level monitoring: Secondary | ICD-10-CM

## 2019-09-17 NOTE — Progress Notes (Signed)
Cardiology Office Note   Date:  09/17/2019   ID:  Shaun [name], DOB 26-Sep-1960, MRN [id number]  PCP:  Shaun Pert, MD  Cardiologist:   Shaun Latch, MD   No chief complaint on file.    History of Present Illness: Shaun [name] is a 59 y.o. male with CAD, PAD, hypertension and hyperlipidemia who presents for follow up.  He was initially seen 07/2017 for an evaluation of chest pain.  He was seen in the ED 05/2017 with two days of pleuritic chest pain that improved with walking.  He also reported indigestion.  EKG showed sinus tachycardia at 123 bpm and no ischemia.  Cardiac enzymes were negative x2.   He was seen in clinic 06/2017 and referred for a coronary CT-A that revealed 40-50% distal left main disease.  FFR of the mid LAD was 0.71 and the distal left circumflex was 0.75, both concerning for ischemia.  His coronary calcium score was in the 98th percentile.   Shaun [name] died of a heart attack at age 3.  After catheterization 09/06/2016 which revealed 60% mid RCA, 100% ostial L PDA, 40% ostial circumflex, 10% left main, percent D1, and 80% proximal to mid LAD.  A drug-eluting stent was placed in the LAD.  The small diagonal and LCx filled with right to left collaterals.    He saw Dr. Fletcher [name] 02/2019 and given that he had no claudication medical management with yearly follow-up was recommended.  Since his last appointment he has been feeling well.  He has not been getting as much exercise as usual.  He typically likes to hike but has not been doing this lately.  He got out of his exercise routine due to COVID-19.  He has no exertional chest pain or shortness of breath.  He denies claudication, lower extremity edema, orthopnea, or PND.  Overall he has been feeling well and is without complaint.   Past Medical History:  Diagnosis Date  . Atypical chest pain 06/05/2017  . CAD in native artery 07/19/2017   12/18 PCI/DESx1 to mLAD, residual disease in LM, with CTO of dLcx  .  Essential hypertension 06/05/2017  . Family history of premature CAD 06/05/2017  . Hyperlipidemia with target low density lipoprotein (LDL) cholesterol less than 100 mg/dL   . Hypertension     Past Surgical History:  Procedure Laterality Date  . CORONARY ANGIOPLASTY WITH STENT PLACEMENT    . CORONARY STENT INTERVENTION N/A 08/06/2017   Procedure: CORONARY STENT INTERVENTION;  Surgeon: Shaun Blanks, MD;  Location: [name];  Service: Cardiovascular;  Laterality: N/A;  . LEFT HEART CATH AND CORONARY ANGIOGRAPHY N/A 08/06/2017   Procedure: LEFT HEART CATH AND CORONARY ANGIOGRAPHY;  Surgeon: Shaun Blanks, MD;  Location: [name];  Service: Cardiovascular;  Laterality: N/A;  . PERIPHERAL VASCULAR INTERVENTION Right    "balloon"  . TONSILLECTOMY       Current Outpatient Medications  Medication Sig Dispense Refill  . aspirin EC 81 MG tablet Take 81 mg by mouth daily.    . clopidogrel (PLAVIX) 75 MG tablet TAKE 1 TABLET (75 MG TOTAL) BY MOUTH DAILY WITH BREAKFAST. 90 tablet 0  . enalapril (VASOTEC) 20 MG tablet Take 1 tablet (20 mg total) by mouth daily. 90 tablet 3  . ezetimibe (ZETIA) 10 MG tablet TAKE 1 TABLET BY MOUTH DAILY--(MUST SEE DOCTOR FOR FURTHER REFILLS) 60 tablet 5  . hydrochlorothiazide (HYDRODIURIL) 25 MG tablet TAKE 1 TABLET (25 MG TOTAL)  BY MOUTH DAILY. 90 tablet 0  . metoprolol succinate (TOPROL-XL) 100 MG 24 hr tablet TAKE 1 TABLET (100 MG TOTAL) BY MOUTH DAILY. 90 tablet 0  . Multiple Vitamin (MULTI-VITAMINS) TABS Take 1 tablet by mouth daily.    Shaun [name] Kitchen NIFEdipine (PROCARDIA-XL/ADALAT CC) 60 MG 24 hr tablet Take 60 mg by mouth daily.    . potassium chloride (K-DUR) 10 MEQ tablet TAKE 2 TABLETS BY MOUTH TWICE A DAY 360 tablet 0  . rosuvastatin (CRESTOR) 40 MG tablet TAKE 1 TABLET BY MOUTH ONCE A DAY 90 tablet 3   No current facility-administered medications for this visit.    Allergies:   Latex    Social History:  The patient  reports  that he has quit smoking. His smoking use included cigarettes. He has a 15.00 pack-year smoking history. He has never used smokeless tobacco. He reports current alcohol use of about 4.0 standard drinks of alcohol per week. He reports current drug use. Drug: Marijuana.   Family History:  The patient's family history includes CAD in his [name]; Heart attack in his [name], paternal grandmother, and paternal uncle; Stroke in his brother.    ROS:  Please see the history of present illness.   Otherwise, review of systems are positive for none.   All other systems are reviewed and negative.     PHYSICAL EXAM: VS:  BP 122/82   Pulse 77   Ht 5\' 11"  (1.803 m)   Wt 211 lb (95.7 kg)   SpO2 96%   BMI 29.43 kg/m  , BMI Body mass index is 29.43 kg/m. GENERAL:  Well appearing HEENT: Pupils equal round and reactive, fundi not visualized, oral mucosa unremarkable NECK:  No jugular venous distention, waveform within normal limits, carotid upstroke brisk and symmetric, no bruits LUNGS:  Clear to auscultation bilaterally HEART:  RRR.  PMI not displaced or sustained,S1 and S2 within normal limits, no S3, no S4, no clicks, no rubs, no murmurs ABD:  Flat, positive bowel sounds normal in frequency in pitch, no bruits, no rebound, no guarding, no midline pulsatile mass, no hepatomegaly, no splenomegaly EXT:  1+ L ulnar pulse.  Unable to palpate radial, DP, PT pulses.  No edema, no cyanosis no clubbing SKIN:  No rashes no nodules NEURO:  Cranial nerves II through XII grossly intact, motor grossly intact throughout PSYCH:  Cognitively intact, oriented to person place and time   EKG:  EKG is ordered today. 06/02/17: Sinus tachycardia.  Rate 123 bpm. 04/29/18: Sinus rhythm.  Rate 78 bpm.  09/17/19: Sinus rhythm.  Rate 77 bpm.     Coronary CT-A 07/11/17: IMPRESSION: 1.  40-50% distal left main stenosis. 2.  Possible moderate mid LAD stenosis. 3. Coronary artery calcium score 628 Agatston units, placing  the patient in the 98th percentile for age and gender. This suggests high risk for future cardiac events. FFR 0.71 in the mid LAD, suggesting a hemodynamically significant stenosis. FFR 0.75 in the distal LCx, suggesting a hemodynamically significant Stenosis.  LHC 08/06/17:  Mid RCA lesion is 60% stenosed.  Ost LPDA lesion is 100% stenosed.  Ost Cx lesion is 40% stenosed.  Ost LM to Dist LM lesion is 10% stenosed.  Ost 1st Diag lesion is 100% stenosed.  Prox LAD to Mid LAD lesion is 80% stenosed.  A drug-eluting stent was successfully placed using a STENT PROMUS PREM MR 4.0X16.  Post intervention, there is a 0% residual stenosis.  Recent Labs: No results found for requested labs within last 8760  hours.   01/02/17: Total cholesterol 212, triglycerides 150, HDL 52, LDL 129   Lipid Panel    Component Value Date/Time   CHOL 168 10/15/2017 1035   TRIG 162 (H) 10/15/2017 1035   HDL 52 10/15/2017 1035   LDLCALC 84 10/15/2017 1035      Wt Readings from Last 3 Encounters:  09/17/19 211 lb (95.7 kg)  03/04/19 215 lb (97.5 kg)  07/24/18 213 lb 3.2 oz (96.7 kg)      ASSESSMENT AND PLAN:  # CAD:  # Atypical chest pain: # Family history of premature CAD: # Hyperlipidemia: DES placed in the LAD 08/2017.  He had other 100% occlusions with collateral flow, though he doesn't recall having MI.  We will stop clopidogrel 09/2017.   Simvastatin was switched to rosuvastatin and Zetia was added.  He is doing well clinically.  He will return for fasting lipids and a CMP.  Continue aspirin, clopidogrel, metoprolol, rosuvastatin, and Zetia.  # Hypertension: Blood pressure is minimally elevated today and has been well-controlled at home.  Continue enalapril, hydrochlorothiazide, metoprolol, and nifedipine.  # PAD: Cilostazol was stopped after PCI.  He has no claudication.  We discussed increasing his exercise back to at least 150 minutes weekly.  Continue aspirin, clopidogrel, Zetia, and  rosuvastatin.  He will follow-up annually with Dr. Kirke Corin.     Current medicines are reviewed at length with the patient today.  The patient does not have concerns regarding medicines.  The following changes have been made:  no change  Labs/ tests ordered today include:   Orders Placed This Encounter  Procedures  . Lipid Profile  . Comprehensive Metabolic Panel (CMET)  . EKG 12-Lead     Disposition:   FU with Shaun Deveney C. Duke Salvia, MD, [facility] in 1 year   Signed, Shaun Milne C. Duke Salvia, MD, [facility]  09/17/2019 6:09 PM    [facility]

## 2019-09-17 NOTE — Patient Instructions (Addendum)
  Medication Instructions:  Your physician recommends that you continue on your current medications as directed. Please refer to the Current Medication list given to you today.  *If you need a refill on your cardiac medications before your next appointment, please call your pharmacy*  Lab Work: LP/CMET SOON  If you have labs (blood work) drawn today and your tests are completely normal, you will receive your results only by: Marland Kitchen MyChart Message (if you have MyChart) OR . A paper copy in the mail If you have any lab test that is abnormal or we need to change your treatment, we will call you to review the results.  Testing/Procedures: NONE   Follow-Up: At [facility], you and your health needs are our priority.  As part of our continuing mission to provide you with exceptional heart care, we have created designated Provider Care Teams.  These Care Teams include your primary Cardiologist (physician) and Advanced Practice Providers (APPs -  Physician Assistants and Nurse Practitioners) who all work together to provide you with the care you need, when you need it.  Your next appointment:   12 month(s)  You will receive a reminder letter in the mail two months in advance. If you don't receive a letter, please call our office to schedule the follow-up appointment.  The format for your next appointment:   Either In Person or Virtual  Provider:   You may see Chilton Si, MD or one of the following Advanced Practice Providers on your designated Care Team:    Corine Shelter, PA-C  [place], [place]  Edd Fabian, [place]   Other Instructions TRY TO EXERCISE 150 MINUTES EACH WEEK

## 2019-09-19 DIAGNOSIS — Z006 Encounter for examination for normal comparison and control in clinical research program: Secondary | ICD-10-CM

## 2019-09-19 NOTE — Research (Signed)
Optimize Research Study  2 year Follow Up  Patient doing well at this time, no adverse events at this time. Will follow up with patient for his 3 year follow up.

## 2019-12-04 ENCOUNTER — Ambulatory Visit: Payer: Managed Care, Other (non HMO) | Attending: Family

## 2019-12-04 DIAGNOSIS — Z23 Encounter for immunization: Secondary | ICD-10-CM

## 2019-12-04 NOTE — Progress Notes (Signed)
   Covid-19 Vaccination Clinic  Name:  Shaun [name]    MRN: [id number] DOB: [birth year]  12/04/2019  Mr. Shaun [name] was observed post Covid-19 immunization for 15 minutes without incident. He was provided with Vaccine Information Sheet and instruction to access the V-Safe system.   Mr. Shaun [name] was instructed to call 911 with any severe reactions post vaccine: Marland Kitchen Difficulty breathing  . Swelling of face and throat  . A fast heartbeat  . A bad rash all over body  . Dizziness and weakness   Immunizations Administered    Name Date Dose VIS Date Route   Moderna COVID-19 Vaccine 12/04/2019 10:31 AM 0.5 mL 08/05/2019 Intramuscular   Manufacturer: Moderna   Lot: [id number]   NDC: 73220-254-27

## 2020-01-06 ENCOUNTER — Ambulatory Visit: Payer: Managed Care, Other (non HMO) | Attending: Family

## 2020-01-06 DIAGNOSIS — Z23 Encounter for immunization: Secondary | ICD-10-CM

## 2020-01-06 NOTE — Progress Notes (Signed)
   Covid-19 Vaccination Clinic  Name:  Shaun [name]    MRN: [id number] DOB: 12-29-1960  01/06/2020  Shaun [name] was observed post Covid-19 immunization for 15 minutes without incident. He was provided with Vaccine Information Sheet and instruction to access the V-Safe system.   Shaun [name] was instructed to call 911 with any severe reactions post vaccine: Marland Kitchen Difficulty breathing  . Swelling of face and throat  . A fast heartbeat  . A bad rash all over body  . Dizziness and weakness   Immunizations Administered    Name Date Dose VIS Date Route   Moderna COVID-19 Vaccine 01/06/2020 10:05 AM 0.5 mL 08/2019 Intramuscular   Manufacturer: Moderna   Lot: [id number]   NDC: 34961-164-35

## 2020-08-03 DIAGNOSIS — Z006 Encounter for examination for normal comparison and control in clinical research program: Secondary | ICD-10-CM

## 2020-08-03 NOTE — Research (Signed)
Optimize 3 Year Follow up  Patient doing well at this time. No episodes of chest pain or medication changes to report.  Will follow up with patient this time next year.   Current Outpatient Medications:  .  aspirin EC 81 MG tablet, Take 81 mg by mouth daily., Disp: , Rfl:  .  clopidogrel (PLAVIX) 75 MG tablet, TAKE 1 TABLET (75 MG TOTAL) BY MOUTH DAILY WITH BREAKFAST., Disp: 90 tablet, Rfl: 0 .  enalapril (VASOTEC) 20 MG tablet, Take 1 tablet (20 mg total) by mouth daily., Disp: 90 tablet, Rfl: 3 .  ezetimibe (ZETIA) 10 MG tablet, TAKE 1 TABLET BY MOUTH DAILY--(MUST SEE DOCTOR FOR FURTHER REFILLS), Disp: 60 tablet, Rfl: 5 .  hydrochlorothiazide (HYDRODIURIL) 25 MG tablet, TAKE 1 TABLET (25 MG TOTAL) BY MOUTH DAILY., Disp: 90 tablet, Rfl: 0 .  metoprolol succinate (TOPROL-XL) 100 MG 24 hr tablet, TAKE 1 TABLET (100 MG TOTAL) BY MOUTH DAILY., Disp: 90 tablet, Rfl: 0 .  Multiple Vitamin (MULTI-VITAMINS) TABS, Take 1 tablet by mouth daily., Disp: , Rfl:  .  NIFEdipine (PROCARDIA-XL/ADALAT CC) 60 MG 24 hr tablet, Take 60 mg by mouth daily., Disp: , Rfl:  .  potassium chloride (K-DUR) 10 MEQ tablet, TAKE 2 TABLETS BY MOUTH TWICE A DAY, Disp: 360 tablet, Rfl: 0 .  rosuvastatin (CRESTOR) 40 MG tablet, TAKE 1 TABLET BY MOUTH ONCE A DAY, Disp: 90 tablet, Rfl: 3

## 2020-08-23 ENCOUNTER — Ambulatory Visit: Payer: Managed Care, Other (non HMO) | Attending: Internal Medicine

## 2020-08-23 DIAGNOSIS — Z23 Encounter for immunization: Secondary | ICD-10-CM

## 2020-08-23 NOTE — Progress Notes (Signed)
   Covid-19 Vaccination Clinic  Name:  Shaun [name]    MRN: [id number] DOB: 1961-04-28  08/23/2020  [name] was observed post Covid-19 immunization for 15 minutes without incident. He was provided with Vaccine Information Sheet and instruction to access the V-Safe system.   [name] was instructed to call 911 with any severe reactions post vaccine: Marland Kitchen Difficulty breathing  . Swelling of face and throat  . A fast heartbeat  . A bad rash all over body  . Dizziness and weakness   Immunizations Administered    Name Date Dose VIS Date Route   Moderna Covid-19 Booster Vaccine 08/23/2020  1:43 PM 0.25 mL 06/23/2020 Intramuscular   Manufacturer: Gala Murdoch   Lot: [id number]   NDC: 05110-211-17

## 2020-08-24 ENCOUNTER — Ambulatory Visit: Payer: Managed Care, Other (non HMO)

## 2020-08-30 ENCOUNTER — Other Ambulatory Visit: Payer: Self-pay | Admitting: Cardiovascular Disease

## 2021-02-04 ENCOUNTER — Ambulatory Visit: Payer: Managed Care, Other (non HMO) | Admitting: Cardiovascular Disease

## 2021-02-04 NOTE — Progress Notes (Incomplete)
Cardiology Office Note   Date:  02/04/2021   ID:  Shaun [name], DOB [birth year], MRN [id number]  PCP:  Frederico Hamman, MD  Cardiologist:   Carlena Bjornstad   No chief complaint on file.    History of Present Illness: Shaun [name] is a 60 y.o. male with CAD, PAD, hypertension and hyperlipidemia who presents for follow up.  He was initially seen 07/2017 for an evaluation of chest pain.  He was seen in the ED 05/2017 with two days of pleuritic chest pain that improved with walking.  He also reported indigestion.  EKG showed sinus tachycardia at 123 bpm and no ischemia.  Cardiac enzymes were negative x2.   He was seen in clinic 06/2017 and referred for a coronary CT-A that revealed 40-50% distal left main disease.  FFR of the mid LAD was 0.71 and the distal left circumflex was 0.75, both concerning for ischemia.  His coronary calcium score was in the 98th percentile.   [name] father died of a heart attack at age 68.  After catheterization 09/06/2016 which revealed 60% mid RCA, 100% ostial L PDA, 40% ostial circumflex, 10% left main, percent D1, and 80% proximal to mid LAD.  A drug-eluting stent was placed in the LAD.  The small diagonal and LCx filled with right to left collaterals.    He saw Dr. Kirke Corin 02/2019 and given that he had no claudication medical management with yearly follow-up was recommended.    Today,  He denies any chest pain, shortness of breath, palpitations, or exertional symptoms. No headaches, lightheadedness, or syncope to report. Also has no lower extremity edema, orthopnea or PND.   Past Medical History:  Diagnosis Date   Atypical chest pain 06/05/2017   CAD in native artery 07/19/2017   12/18 PCI/DESx1 to mLAD, residual disease in LM, with CTO of dLcx   Essential hypertension 06/05/2017   Family history of premature CAD 06/05/2017   Hyperlipidemia with target low density lipoprotein (LDL) cholesterol less than 100 mg/dL    Hypertension     Past Surgical  History:  Procedure Laterality Date   CORONARY ANGIOPLASTY WITH STENT PLACEMENT     CORONARY STENT INTERVENTION N/A 08/06/2017   Procedure: CORONARY STENT INTERVENTION;  Surgeon: Kathleene Hazel, MD;  Location: [facility];  Service: Cardiovascular;  Laterality: N/A;   LEFT HEART CATH AND CORONARY ANGIOGRAPHY N/A 08/06/2017   Procedure: LEFT HEART CATH AND CORONARY ANGIOGRAPHY;  Surgeon: Kathleene Hazel, MD;  Location: [facility];  Service: Cardiovascular;  Laterality: N/A;   PERIPHERAL VASCULAR INTERVENTION Right    "balloon"   TONSILLECTOMY       Current Outpatient Medications  Medication Sig Dispense Refill   aspirin EC 81 MG tablet Take 81 mg by mouth daily.     clopidogrel (PLAVIX) 75 MG tablet TAKE 1 TABLET (75 MG TOTAL) BY MOUTH DAILY WITH BREAKFAST. 90 tablet 0   enalapril (VASOTEC) 20 MG tablet Take 1 tablet (20 mg total) by mouth daily. 90 tablet 3   ezetimibe (ZETIA) 10 MG tablet TAKE 1 TABLET BY MOUTH DAILY (MUST SEE DOCTOR FOR FURTHER REFILLS) 90 tablet 3   hydrochlorothiazide (HYDRODIURIL) 25 MG tablet TAKE 1 TABLET (25 MG TOTAL) BY MOUTH DAILY. 90 tablet 0   metoprolol succinate (TOPROL-XL) 100 MG 24 hr tablet TAKE 1 TABLET (100 MG TOTAL) BY MOUTH DAILY. 90 tablet 0   Multiple Vitamin (MULTI-VITAMINS) TABS Take 1 tablet by mouth daily.  NIFEdipine (PROCARDIA-XL/ADALAT CC) 60 MG 24 hr tablet Take 60 mg by mouth daily.     potassium chloride (K-DUR) 10 MEQ tablet TAKE 2 TABLETS BY MOUTH TWICE A DAY 360 tablet 0   rosuvastatin (CRESTOR) 40 MG tablet TAKE 1 TABLET BY MOUTH ONCE A DAY 90 tablet 3   No current facility-administered medications for this visit.    Allergies:   Latex    Social History:  The patient  reports that he has quit smoking. His smoking use included cigarettes. He has a 15.00 pack-year smoking history. He has never used smokeless tobacco. He reports current alcohol use of about 4.0 standard drinks of alcohol  per week. He reports current drug use. Drug: Marijuana.   Family History:  The patient's family history includes CAD in his father; Heart attack in his father, paternal grandmother, and paternal uncle; Stroke in his brother.    ROS:  Please see the history of present illness. (+) All other systems are reviewed and negative.    PHYSICAL EXAM: VS:  There were no vitals taken for this visit. , BMI There is no height or weight on file to calculate BMI. GENERAL:  Well appearing HEENT: Pupils equal round and reactive, fundi not visualized, oral mucosa unremarkable NECK:  No jugular venous distention, waveform within normal limits, carotid upstroke brisk and symmetric, no bruits LUNGS:  Clear to auscultation bilaterally HEART:  RRR.  PMI not displaced or sustained,S1 and S2 within normal limits, no S3, no S4, no clicks, no rubs, no murmurs ABD:  Flat, positive bowel sounds normal in frequency in pitch, no bruits, no rebound, no guarding, no midline pulsatile mass, no hepatomegaly, no splenomegaly EXT:  1+ L ulnar pulse.***  Unable to palpate radial, DP, PT pulses.  No edema, no cyanosis no clubbing SKIN:  No rashes no nodules NEURO:  Cranial nerves II through XII grossly intact, motor grossly intact throughout PSYCH:  Cognitively intact, oriented to person place and time   EKG:   06/02/17: Sinus tachycardia.  Rate 123 bpm. 04/29/18: Sinus rhythm.  Rate 78 bpm.  09/17/19: Sinus rhythm.  Rate 77 bpm.   02/04/2021: ***  [place] LE Arterial 02/13/2019: Right: 75-99% stenosis noted in the mid superficial femoral artery.  50-74% stenosis in the TPT, low end range.   ABI 02/13/2019: Right: Resting right ankle-brachial index indicates mild right lower  extremity arterial disease. The right toe-brachial index is normal.   Left: Resting left ankle-brachial index is within normal range. No  evidence of significant left lower extremity arterial disease. The left  toe-brachial index is normal.   Coronary  CT-A 07/11/17: IMPRESSION: 1.  40-50% distal left main stenosis. 2.  Possible moderate mid LAD stenosis. 3. Coronary artery calcium score 628 Agatston units, placing the patient in the 98th percentile for age and gender. This suggests high risk for future cardiac events. FFR 0.71 in the mid LAD, suggesting a hemodynamically significant stenosis. FFR 0.75 in the distal LCx, suggesting a hemodynamically significant Stenosis.  LHC 08/06/17:  Mid RCA lesion is 60% stenosed.  Ost LPDA lesion is 100% stenosed.  Ost Cx lesion is 40% stenosed.  Ost LM to Dist LM lesion is 10% stenosed.  Ost 1st Diag lesion is 100% stenosed.  Prox LAD to Mid LAD lesion is 80% stenosed.  A drug-eluting stent was successfully placed using a STENT PROMUS PREM MR 4.0X16.  Post intervention, there is a 0% residual stenosis.  Recent Labs: No results found for requested labs within last  8760 hours.   01/02/17: Total cholesterol 212, triglycerides 150, HDL 52, LDL 129   Lipid Panel    Component Value Date/Time   CHOL 168 10/15/2017 1035   TRIG 162 (H) 10/15/2017 1035   HDL 52 10/15/2017 1035   LDLCALC 84 10/15/2017 1035      Wt Readings from Last 3 Encounters:  09/17/19 211 lb (95.7 kg)  03/04/19 215 lb (97.5 kg)  07/24/18 213 lb 3.2 oz (96.7 kg)      ASSESSMENT AND PLAN: No problem-specific Assessment & Plan notes found for this encounter.   # CAD:  # Atypical chest pain: # Family history of premature CAD: # Hyperlipidemia: DES placed in the LAD 08/2017.  He had other 100% occlusions with collateral flow, though he doesn't recall having MI.  We will stop clopidogrel 09/2017.   Simvastatin was switched to rosuvastatin and Zetia was added.  He is doing well clinically.  He will return for fasting lipids and a CMP.  Continue aspirin, clopidogrel, metoprolol, rosuvastatin, and Zetia.  # Hypertension: Blood pressure is minimally elevated today and has been well-controlled at home.  Continue  enalapril, hydrochlorothiazide, metoprolol, and nifedipine.  # PAD: Cilostazol was stopped after PCI.  He has no claudication.  We discussed increasing his exercise back to at least 150 minutes weekly.  Continue aspirin, clopidogrel, Zetia, and rosuvastatin.  He will follow-up annually with Dr. Kirke Corin.     Current medicines are reviewed at length with the patient today.  The patient does not have concerns regarding medicines.  The following changes have been made:  no change  Labs/ tests ordered today include:   No orders of the defined types were placed in this encounter.    Disposition:   FU with Tiffany C. Duke Salvia, MD, [facility] in ***1 year  I,Mathew Stumpf,acting as a scribe for Chilton Si, MD.,have documented all relevant documentation on the behalf of Chilton Si, MD,as directed by  Chilton Si, MD while in the presence of Chilton Si, MD.  ***  Signed, Tiffany C. Duke Salvia, MD, [facility]  02/04/2021 7:39 AM    [facility]

## 2021-06-16 ENCOUNTER — Other Ambulatory Visit: Payer: Self-pay

## 2021-06-16 ENCOUNTER — Encounter (HOSPITAL_BASED_OUTPATIENT_CLINIC_OR_DEPARTMENT_OTHER): Payer: Self-pay | Admitting: Cardiovascular Disease

## 2021-06-16 ENCOUNTER — Ambulatory Visit (HOSPITAL_BASED_OUTPATIENT_CLINIC_OR_DEPARTMENT_OTHER): Payer: Managed Care, Other (non HMO) | Admitting: Cardiovascular Disease

## 2021-06-16 VITALS — BP 128/82 | HR 74 | Ht 71.0 in | Wt 190.9 lb

## 2021-06-16 DIAGNOSIS — R413 Other amnesia: Secondary | ICD-10-CM

## 2021-06-16 DIAGNOSIS — I2510 Atherosclerotic heart disease of native coronary artery without angina pectoris: Secondary | ICD-10-CM | POA: Diagnosis not present

## 2021-06-16 DIAGNOSIS — E7800 Pure hypercholesterolemia, unspecified: Secondary | ICD-10-CM | POA: Diagnosis not present

## 2021-06-16 DIAGNOSIS — I10 Essential (primary) hypertension: Secondary | ICD-10-CM

## 2021-06-16 DIAGNOSIS — I739 Peripheral vascular disease, unspecified: Secondary | ICD-10-CM

## 2021-06-16 DIAGNOSIS — Z9861 Coronary angioplasty status: Secondary | ICD-10-CM

## 2021-06-16 HISTORY — DX: Peripheral vascular disease, unspecified: I73.9

## 2021-06-16 NOTE — Progress Notes (Addendum)
Cardiology Office Note   Date:  06/16/2021   ID:  Shaun [name], DOB 1961-08-29, MRN [id number]  PCP:  Frederico Hamman, MD  Cardiologist:   Chilton Si, MD   No chief complaint on file.    History of Present Illness: Shaun [name] is a 60 y.o. male with CAD, PAD, hypertension, prediabetes, and hyperlipidemia who presents for follow up.  He was initially seen 07/2017 for an evaluation of chest pain.  He was seen in the ED 05/2017 with two days of pleuritic chest pain that improved with walking.  He also reported indigestion.  EKG showed sinus tachycardia at 123 bpm and no ischemia.  Cardiac enzymes were negative x2.   He was seen in clinic 06/2017 and referred for a coronary CT-A that revealed 40-50% distal left main disease.  FFR of the mid LAD was 0.71 and the distal left circumflex was 0.75, both concerning for ischemia.  His coronary calcium score was in the 98th percentile.   [name] father died of a heart attack at age 27.  After catheterization 09/06/2016 which revealed 60% mid RCA, 100% ostial L PDA, 40% ostial circumflex, 10% left main, percent D1, and 80% proximal to mid LAD.  A drug-eluting stent was placed in the LAD.  The small diagonal and LCx filled with right to left collaterals.    He saw Dr. Kirke Corin 02/2019 and given that he had no claudication medical management with yearly follow-up was recommended.  [name] was last seen in 2021.  Since that time he has been doing well. He has been walking some for exercise on average 3 days per week for 30 minutes.  He has no exertional chest pain or shortness of breath. He denies LE edema, orthopnea or PND.  His BP at home has been consistently around 120/80.  His only complaint is that he feels like his thinking is slowing down.  He doesn't feel cognitively sharp.  He denies word-finding difficulty or memory loss.  He struggles to do computations and solve problems that would have been easy for him in the past.   Past Medical  History:  Diagnosis Date   Atypical chest pain 06/05/2017   CAD in native artery 07/19/2017   12/18 PCI/DESx1 to mLAD, residual disease in LM, with CTO of dLcx   Essential hypertension 06/05/2017   Family history of premature CAD 06/05/2017   Hyperlipidemia with target low density lipoprotein (LDL) cholesterol less than 100 mg/dL    Hypertension    PAD (peripheral artery disease) (HCC) 06/16/2021    Past Surgical History:  Procedure Laterality Date   CORONARY ANGIOPLASTY WITH STENT PLACEMENT     CORONARY STENT INTERVENTION N/A 08/06/2017   Procedure: CORONARY STENT INTERVENTION;  Surgeon: Kathleene Hazel, MD;  Location: [facility];  Service: Cardiovascular;  Laterality: N/A;   LEFT HEART CATH AND CORONARY ANGIOGRAPHY N/A 08/06/2017   Procedure: LEFT HEART CATH AND CORONARY ANGIOGRAPHY;  Surgeon: Kathleene Hazel, MD;  Location: [facility];  Service: Cardiovascular;  Laterality: N/A;   PERIPHERAL VASCULAR INTERVENTION Right    "balloon"   TONSILLECTOMY       Current Outpatient Medications  Medication Sig Dispense Refill   aspirin EC 81 MG tablet Take 81 mg by mouth daily.     clopidogrel (PLAVIX) 75 MG tablet TAKE 1 TABLET (75 MG TOTAL) BY MOUTH DAILY WITH BREAKFAST. 90 tablet 0   enalapril (VASOTEC) 20 MG tablet Take 1 tablet (20 mg total)  by mouth daily. 90 tablet 3   ezetimibe (ZETIA) 10 MG tablet TAKE 1 TABLET BY MOUTH DAILY (MUST SEE DOCTOR FOR FURTHER REFILLS) 90 tablet 3   hydrochlorothiazide (HYDRODIURIL) 25 MG tablet TAKE 1 TABLET (25 MG TOTAL) BY MOUTH DAILY. 90 tablet 0   metoprolol succinate (TOPROL-XL) 100 MG 24 hr tablet TAKE 1 TABLET (100 MG TOTAL) BY MOUTH DAILY. 90 tablet 0   Multiple Vitamin (MULTI-VITAMINS) TABS Take 1 tablet by mouth daily.     NIFEdipine (PROCARDIA-XL/ADALAT CC) 60 MG 24 hr tablet Take 60 mg by mouth daily.     potassium chloride (K-DUR) 10 MEQ tablet TAKE 2 TABLETS BY MOUTH TWICE A DAY 360 tablet 0   rosuvastatin  (CRESTOR) 40 MG tablet TAKE 1 TABLET BY MOUTH ONCE A DAY 90 tablet 3   No current facility-administered medications for this visit.    Allergies:   Latex    Social History:  The patient  reports that he has quit smoking. His smoking use included cigarettes. He has a 15.00 pack-year smoking history. He has never used smokeless tobacco. He reports current alcohol use of about 4.0 standard drinks per week. He reports current drug use. Drug: Marijuana.   Family History:  The patient's family history includes CAD in his father; Heart attack in his father, paternal grandmother, and paternal uncle; Stroke in his brother.    ROS:  Please see the history of present illness.   Otherwise, review of systems are positive for none.   All other systems are reviewed and negative.    PHYSICAL EXAM: VS:  BP 128/82 (BP Location: Left Arm, Patient Position: Sitting, Cuff Size: Normal)   Pulse 74   Ht 5\' 11"  (1.803 m)   Wt 190 lb 14.4 oz (86.6 kg)   BMI 26.63 kg/m  , BMI Body mass index is 26.63 kg/m. GENERAL:  Well appearing HEENT: Pupils equal round and reactive, fundi not visualized, oral mucosa unremarkable NECK:  No jugular venous distention, waveform within normal limits, carotid upstroke brisk and symmetric, no bruits LUNGS:  Clear to auscultation bilaterally HEART:  RRR.  PMI not displaced or sustained,S1 and S2 within normal limits, no S3, no S4, no clicks, no rubs, no murmurs ABD:  Flat, positive bowel sounds normal in frequency in pitch, no bruits, no rebound, no guarding, no midline pulsatile mass, no hepatomegaly, no splenomegaly EXT:  1+ L ulnar pulse.  Unable to palpate radial, DP, PT pulses.  No edema, no cyanosis no clubbing SKIN:  No rashes no nodules NEURO:  Cranial nerves II through XII grossly intact, motor grossly intact throughout PSYCH:  Cognitively intact, oriented to person place and time   EKG:  EKG is ordered today. 06/02/17: Sinus tachycardia.  Rate 123 bpm. 04/29/18:  Sinus rhythm.  Rate 78 bpm.  09/17/19: Sinus rhythm.  Rate 77 bpm.   06/16/2021: Sinus rhythm.  Rate 74 bpm.   Coronary CT-A 07/11/17: IMPRESSION: 1.  40-50% distal left main stenosis. 2.  Possible moderate mid LAD stenosis. 3. Coronary artery calcium score 628 Agatston units, placing the patient in the 98th percentile for age and gender. This suggests high risk for future cardiac events. FFR 0.71 in the mid LAD, suggesting a hemodynamically significant stenosis. FFR 0.75 in the distal LCx, suggesting a hemodynamically significant Stenosis.  LHC 08/06/17: Mid RCA lesion is 60% stenosed. Ost LPDA lesion is 100% stenosed. Ost Cx lesion is 40% stenosed. Ost LM to Dist LM lesion is 10% stenosed. Ost 1st Diag lesion  is 100% stenosed. Prox LAD to Mid LAD lesion is 80% stenosed. A drug-eluting stent was successfully placed using a STENT PROMUS PREM MR 4.0X16. Post intervention, there is a 0% residual stenosis.  Recent Labs: No results found for requested labs within last 8760 hours.   01/02/17: Total cholesterol 212, triglycerides 150, HDL 52, LDL 129  01/17/21:  Total cholesterol 127, triglycerides 97, HDL 54, LDLD 60.7  Lipid Panel    Component Value Date/Time   CHOL 168 10/15/2017 1035   TRIG 162 (H) 10/15/2017 1035   HDL 52 10/15/2017 1035   LDLCALC 84 10/15/2017 1035       Wt Readings from Last 3 Encounters:  06/16/21 190 lb 14.4 oz (86.6 kg)  09/17/19 211 lb (95.7 kg)  03/04/19 215 lb (97.5 kg)      ASSESSMENT AND PLAN:  # CAD:  # Atypical chest pain: # Family history of premature CAD: # Hyperlipidemia: DES placed in the LAD 08/2017.  He had other 100% occlusions with collateral flow, though he doesn't recall having MI.  He is doing well clinically.  He walks regularly without angina.  He will work on increasing to 150 minutes per week.  Continue aspirin, clopidogrel, metoprolol, rosuvastatin and Zetia.    # Hypertension: Blood pressure is well-controlled on  HCTZ, enalapril and nifedipine.   # PAD: Cilostazol was stopped after PCI.  He has no claudication.  We discussed increasing his exercise back to at least 150 minutes weekly.  Continue aspirin, clopidogrel, Zetia, and rosuvastatin.  He will follow-up annually with Dr. Kirke Corin.    # Cognitive decline:  Mr. Cardozo feels like he has more cognitive decline than he would expect.  We will refer to Neurology to assess.   Current medicines are reviewed at length with the patient today.  The patient does not have concerns regarding medicines.  The following changes have been made:  no change  Labs/ tests ordered today include:   Orders Placed This Encounter  Procedures   Ambulatory referral to Neurology   EKG 12-Lead      Disposition:   FU with Classie Weng C. Duke Salvia, MD, [facility] in 1 year   Signed, Jah Alarid C. Duke Salvia, MD, [facility]  06/16/2021 9:36 AM    [facility]

## 2021-06-16 NOTE — Patient Instructions (Addendum)
Medication Instructions:  Your physician recommends that you continue on your current medications as directed. Please refer to the Current Medication list given to you today.   *If you need a refill on your cardiac medications before your next appointment, please call your pharmacy*  Lab Work: NONE  Testing/Procedures: NONE  Follow-Up: At [facility], you and your health needs are our priority.  As part of our continuing mission to provide you with exceptional heart care, we have created designated Provider Care Teams.  These Care Teams include your primary Cardiologist (physician) and Advanced Practice Providers (APPs -  Physician Assistants and Nurse Practitioners) who all work together to provide you with the care you need, when you need it.  We recommend signing up for the patient portal called "MyChart".  Sign up information is provided on this After Visit Summary.  MyChart is used to connect with patients for Virtual Visits (Telemedicine).  Patients are able to view lab/test results, encounter notes, upcoming appointments, etc.  Non-urgent messages can be sent to your provider as well.   To learn more about what you can do with MyChart, go to [email or web address].    Your next appointment:   12 month(s)  The format for your next appointment:   In Person  Provider:   Chilton Si, MD  You have been referred to Neurology  Call the office if you have  not from them in 2 weeks   Other Instructions  INCREASE EXERCISE TO 150 MINUTES EACH WEEK

## 2021-06-17 ENCOUNTER — Encounter: Payer: Self-pay | Admitting: Physician Assistant

## 2021-06-17 ENCOUNTER — Ambulatory Visit (INDEPENDENT_AMBULATORY_CARE_PROVIDER_SITE_OTHER): Payer: Managed Care, Other (non HMO) | Admitting: Physician Assistant

## 2021-06-17 ENCOUNTER — Other Ambulatory Visit (INDEPENDENT_AMBULATORY_CARE_PROVIDER_SITE_OTHER): Payer: Managed Care, Other (non HMO)

## 2021-06-17 VITALS — BP 132/84 | HR 76 | Resp 18 | Ht 71.0 in | Wt 191.0 lb

## 2021-06-17 DIAGNOSIS — R413 Other amnesia: Secondary | ICD-10-CM

## 2021-06-17 LAB — VITAMIN B12: Vitamin B-12: 923 pg/mL — ABNORMAL HIGH (ref 211–911)

## 2021-06-17 LAB — TSH: TSH: 1.31 u[IU]/mL (ref 0.35–5.50)

## 2021-06-17 NOTE — Patient Instructions (Addendum)
It was a pleasure to see you today at our office.   Recommendations:  MRI of the brain, the radiology office will call you to arrange you appointment Check labs today Follow up  pending on MRI  RECOMMENDATIONS FOR ALL PATIENTS WITH MEMORY PROBLEMS: 1. Continue to exercise (Recommend 30 minutes of walking everyday, or 3 hours every week) 2. Increase social interactions - continue going to [place] and enjoy social gatherings with friends and family 3. Eat healthy, avoid fried foods and eat more fruits and vegetables 4. Maintain adequate blood pressure, blood sugar, and blood cholesterol level. Reducing the risk of stroke and cardiovascular disease also helps promoting better memory. 5. Avoid stressful situations. Live a simple life and avoid aggravations. Organize your time and prepare for the next day in anticipation. 6. Sleep well, avoid any interruptions of sleep and avoid any distractions in the bedroom that may interfere with adequate sleep quality 7. Avoid sugar, avoid sweets as there is a strong link between excessive sugar intake, diabetes, and cognitive impairment We discussed the Mediterranean diet, which has been shown to help patients reduce the risk of progressive memory disorders and reduces cardiovascular risk. This includes eating fish, eat fruits and green leafy vegetables, nuts like almonds and hazelnuts, walnuts, and also use olive oil. Avoid fast foods and fried foods as much as possible. Avoid sweets and sugar as sugar use has been linked to worsening of memory function.  There is always a concern of gradual progression of memory problems. If this is the case, then we may need to adjust level of care according to patient needs. Support, both to the patient and caregiver, should then be put into place.        FALL PRECAUTIONS: Be cautious when walking. Scan the area for obstacles that may increase the risk of trips and falls. When getting up in the mornings, sit up at the  edge of the bed for a few minutes before getting out of bed. Consider elevating the bed at the head end to avoid drop of blood pressure when getting up. Walk always in a well-lit room (use night lights in the walls). Avoid area rugs or power cords from appliances in the middle of the walkways. Use a walker or a cane if necessary and consider physical therapy for balance exercise. Get your eyesight checked regularly.  FINANCIAL OVERSIGHT: Supervision, especially oversight when making financial decisions or transactions is also recommended.  HOME SAFETY: Consider the safety of the kitchen when operating appliances like stoves, microwave oven, and blender. Consider having supervision and share cooking responsibilities until no longer able to participate in those. Accidents with firearms and other hazards in the house should be identified and addressed as well.   ABILITY TO BE LEFT ALONE: If patient is unable to contact 911 operator, consider using LifeLine, or when the need is there, arrange for someone to stay with patients. Smoking is a fire hazard, consider supervision or cessation. Risk of wandering should be assessed by caregiver and if detected at any point, supervision and safe proof recommendations should be instituted.  MEDICATION SUPERVISION: Inability to self-administer medication needs to be constantly addressed. Implement a mechanism to ensure safe administration of the medications.   DRIVING: Regarding driving, in patients with progressive memory problems, driving will be impaired. We advise to have someone else do the driving if trouble finding directions or if minor accidents are reported. Independent driving assessment is available to determine safety of driving.   If you are  interested in the driving assessment, you can contact the following:  [employer] in [place]  [place] [phone] or [phone]    Clarence refers to food and lifestyle choices that are based on the traditions of countries located on the [employer]. This way of eating has been shown to help prevent certain conditions and improve outcomes for people who have chronic diseases, like kidney disease and heart disease. What are tips for following this plan? Lifestyle  Cook and eat meals together with your family, when possible. Drink enough fluid to keep your urine clear or pale yellow. Be physically active every day. This includes: Aerobic exercise like running or swimming. Leisure activities like gardening, walking, or housework. Get 7-8 hours of sleep each night. If recommended by your health care provider, drink red wine in moderation. This means 1 glass a day for nonpregnant women and 2 glasses a day for men. A glass of wine equals 5 oz (150 mL). Reading food labels  Check the serving size of packaged foods. For foods such as rice and pasta, the serving size refers to the amount of cooked product, not dry. Check the total fat in packaged foods. Avoid foods that have saturated fat or trans fats. Check the ingredients list for added sugars, such as corn syrup. Shopping  At the grocery store, buy most of your food from the areas near the walls of the store. This includes: Fresh fruits and vegetables (produce). Grains, beans, nuts, and seeds. Some of these may be available in unpackaged forms or large amounts (in bulk). Fresh seafood. Poultry and eggs. Low-fat dairy products. Buy whole ingredients instead of prepackaged foods. Buy fresh fruits and vegetables in-season from local farmers markets. Buy frozen fruits and vegetables in resealable bags. If you do not have access to quality fresh seafood, buy precooked frozen shrimp or canned fish, such as tuna, salmon, or sardines. Buy small amounts of raw or cooked  vegetables, salads, or olives from the deli or salad bar at your store. Stock your pantry so you always have certain foods on hand, such as olive oil, canned tuna, canned tomatoes, rice, pasta, and beans. Cooking  Cook foods with extra-virgin olive oil instead of using butter or other vegetable oils. Have meat as a side dish, and have vegetables or grains as your main dish. This means having meat in small portions or adding small amounts of meat to foods like pasta or stew. Use beans or vegetables instead of meat in common dishes like chili or lasagna. Experiment with different cooking methods. Try roasting or broiling vegetables instead of steaming or sauteing them. Add frozen vegetables to soups, stews, pasta, or rice. Add nuts or seeds for added healthy fat at each meal. You can add these to yogurt, salads, or vegetable dishes. Marinate fish or vegetables using olive oil, lemon juice, garlic, and fresh herbs. Meal planning  Plan to eat 1 vegetarian meal one day each week. Try to work up to 2 vegetarian meals, if possible. Eat seafood 2 or more times a week. Have healthy snacks readily available, such as: Vegetable sticks with hummus. Greek yogurt. Fruit and nut trail mix. Eat balanced meals throughout the week. This includes: Fruit: 2-3 servings a day Vegetables: 4-5 servings a day Low-fat dairy: 2 servings a day Fish, poultry, or lean meat: 1 serving a day Beans and legumes: 2 or more servings a week  Nuts and seeds: 1-2 servings a day Whole grains: 6-8 servings a day Extra-virgin olive oil: 3-4 servings a day Limit red meat and sweets to only a few servings a month What are my food choices? Mediterranean diet Recommended Grains: Whole-grain pasta. Brown rice. Bulgar wheat. Polenta. Couscous. Whole-wheat bread. Orpah Cobb. Vegetables: Artichokes. Beets. Broccoli. Cabbage. Carrots. Eggplant. Green beans. Chard. Kale. Spinach. Onions. Leeks. Peas. Squash. Tomatoes. Peppers.  Radishes. Fruits: Apples. Apricots. Avocado. Berries. Bananas. Cherries. Dates. Figs. Grapes. Lemons. Melon. Oranges. Peaches. Plums. Pomegranate. Meats and other protein foods: Beans. Almonds. Sunflower seeds. Pine nuts. Peanuts. Cod. Salmon. Scallops. Shrimp. Tuna. Tilapia. Clams. Oysters. Eggs. Dairy: Low-fat milk. Cheese. Greek yogurt. Beverages: Water. Red wine. Herbal tea. Fats and oils: Extra virgin olive oil. Avocado oil. Grape seed oil. Sweets and desserts: [place] yogurt with honey. Baked apples. Poached pears. Trail mix. Seasoning and other foods: Basil. Cilantro. Coriander. Cumin. Mint. Parsley. Sage. Rosemary. Tarragon. Garlic. Oregano. Thyme. Pepper. Balsalmic vinegar. Tahini. Hummus. Tomato sauce. Olives. Mushrooms. Limit these Grains: Prepackaged pasta or rice dishes. Prepackaged cereal with added sugar. Vegetables: Deep fried potatoes (french fries). Fruits: Fruit canned in syrup. Meats and other protein foods: Beef. Pork. Lamb. Poultry with skin. Hot dogs. Tomasa Blase. Dairy: Ice cream. Sour cream. Whole milk. Beverages: Juice. Sugar-sweetened soft drinks. Beer. Liquor and spirits. Fats and oils: Butter. Canola oil. Vegetable oil. Beef fat (tallow). Lard. Sweets and desserts: Cookies. Cakes. Pies. Candy. Seasoning and other foods: Mayonnaise. Premade sauces and marinades. The items listed may not be a complete list. Talk with your dietitian about what dietary choices are right for you. Summary The Mediterranean diet includes both food and lifestyle choices. Eat a variety of fresh fruits and vegetables, beans, nuts, seeds, and whole grains. Limit the amount of red meat and sweets that you eat. Talk with your health care provider about whether it is safe for you to drink red wine in moderation. This means 1 glass a day for nonpregnant women and 2 glasses a day for men. A glass of wine equals 5 oz (150 mL). This information is not intended to replace advice given to you by your health  care provider. Make sure you discuss any questions you have with your health care provider. Document Released: 04/13/2016 Document Revised: 05/16/2016 Document Reviewed: 04/13/2016 Elsevier Interactive Patient Education  2017 [employer].  We have sent a referral to [facility] for your MRI and they will call you directly to schedule your appointment. They are located at [address] [facility]. If you need to contact them directly please call [phone].   Your provider has requested that you have labwork completed today. Please go to [facility]ocr[facility]ology ([id number]) on the second floor of this building before leaving the office today. You do not need to check in. If you are not called within 15 minutes please check with the front desk.

## 2021-06-17 NOTE — Progress Notes (Signed)
Assessment/Plan:   Shaun [name] is a 60 y.o. year old male with risk factors including hypertension, hyperlipidemia, CAD, PAD, CKD stage II seen today for evaluation of memory loss.  MoCA today is 29/30, with the only deficiency in difficulty drawing a cube (visual-spatial executive).    Recommendations:   Memory Loss   MRI brain with/without contrast to assess for underlying structural abnormality and assess vascular load   Check B12, TSH Discussed safety both in and out of the home.  Discussed the importance of regular daily schedule with inclusion of crossword puzzles to maintain brain function.  Discussed the importance of stress management.  May benefit from relaxation techniques, breathing techniques, and cognitive behavioral therapy. Continue to monitor mood with PCP.  Stay active at least 30 minutes at least 3 times a week.  Naps should be scheduled and should be no longer than 60 minutes and should not occur after 2 PM.  Mediterranean diet is recommended  Folllow up pending on the results of the MRI  Subjective:    The patient is seen in neurologic consultation at the request of Chilton Si, MD for the evaluation of memory.  The patient is here alone.  This is a 60 y.o. 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  He lives alone, is single, in he reports that no one else but him noticed this changes.  He states that "the thinking is slowing down, I do not feel as sharp as before, at times having word finding difficulty, or struggling to do a problem solving or calculations, which without being very easy in the past.   His mood is "good ", he denies any depression.  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  He works from home for at least 12 years.  He states that "cannot let go, or relax from stress ".  He sleeps well, denies vivid dreams or sleepwalking, hallucinations or paranoia.  He denies leaving objects in unusual places.  He is independent of bathing and dressing.  He denies any issues with taking his medications or missing any bills.  Intentionally, he has lost 20 pounds over the last year with diet "I feel fuller sooner ".  He denies trouble swallowing.  He eats out a lot, barely cooks, denies leaving the stove on.  He ambulates without difficulty, he walks 30 minutes 3-5 times a week.  He drives and denies getting lost, uses only GPS when he is in unfamiliar places.  He denies any headaches, double vision, dizziness, focal numbness or tingling, unilateral weakness or tremors.  Denies urine incontinence, retention, constipation or diarrhea.  Denies anosmia.  Never had COVID.  He denies a history of OSA, or alcohol.  He quit 15-pack-year cigarette use 20 years ago.  He drinks 4 standard drinks a week.  He partakes marijuana daily (1 joint).  Family history negative for dementia, although parents died when he was a small child.  His maternal grandmother who raised him did not have dementia.  He works Air traffic controller.  He has college degree from [employer] in [place], with BA degree.     Allergies  Allergen Reactions  Latex Rash    Current Outpatient Medications  Medication Instructions   aspirin EC 81 mg, Oral, Daily   clopidogrel (PLAVIX) 75 mg, Oral, Daily with breakfast   enalapril (VASOTEC) 20 mg, Oral, Daily   ezetimibe (ZETIA) 10 MG tablet TAKE 1 TABLET BY MOUTH DAILY (MUST SEE DOCTOR FOR FURTHER REFILLS)   hydrochlorothiazide (HYDRODIURIL) 25 mg, Oral, Daily   metoprolol succinate (TOPROL-XL) 100 mg, Oral, Daily   Multiple Vitamin (MULTI-VITAMINS) TABS 1 tablet, Oral, Daily   NIFEdipine (ADALAT CC) 60 mg, Oral, Daily   potassium chloride (K-DUR) 10 MEQ tablet TAKE 2  TABLETS BY MOUTH TWICE A DAY   rosuvastatin (CRESTOR) 40 MG tablet TAKE 1 TABLET BY MOUTH ONCE A DAY     VITALS:   Vitals:   06/17/21 0804  BP: 132/84  Pulse: 76  Resp: 18  SpO2: 100%  Weight: 191 lb (86.6 kg)  Height: 5\' 11"  (1.803 m)   No flowsheet data found.  PHYSICAL EXAM   HEENT:  Normocephalic, atraumatic. The mucous membranes are moist. The superficial temporal arteries are without ropiness or tenderness. Cardiovascular: Regular rate and rhythm. Lungs: Clear to auscultation bilaterally. Neck: There are no carotid bruits noted bilaterally.  NEUROLOGICAL: Montreal Cognitive Assessment  06/17/2021  Visuospatial/ Executive (0/5) 4  Naming (0/3) 3  Attention: Read list of digits (0/2) 2  Attention: Read list of letters (0/1) 1  Attention: Serial 7 subtraction starting at 100 (0/3) 3  Language: Repeat phrase (0/2) 2  Language : Fluency (0/1) 1  Abstraction (0/2) 2  Delayed Recall (0/5) 5  Orientation (0/6) 6  Total 29  Adjusted Score (based on education) 29   No flowsheet data found.  No flowsheet data found.   Orientation:  Alert and oriented to person, place and time. No aphasia or dysarthria. Fund of knowledge is appropriate. Recent memory and remote memory intact.  Attention and concentration are normal.  Able to name objects and repeat phrases. Delayed recall  5/5 orientation 6/6 Cranial nerves: There is good facial symmetry. Extraocular muscles are intact and visual fields are full to confrontational testing. Speech is fluent and clear. Soft palate rises symmetrically and there is no tongue deviation. Hearing is intact to conversational tone. Tone: Tone is good throughout. Sensation: Sensation is intact to light touch and pinprick throughout. Vibration is intact at the bilateral big toe.There is no extinction with double simultaneous stimulation. There is no sensory dermatomal level identified. Coordination: The patient has no difficulty with RAM's or FNF  bilaterally. Normal finger to nose  Motor: Strength is 5/5 in the bilateral upper and lower extremities. There is no pronator drift. There are no fasciculations noted. DTR's: Deep tendon reflexes are 2/4 at the bilateral biceps, triceps, brachioradialis, patella and achilles.  Plantar responses are downgoing bilaterally. Gait and Station: The patient is able to ambulate without difficulty.The patient is able to heel toe walk without any difficulty.The patient is able to ambulate in a tandem fashion. The patient is able to stand in the Romberg position.     Thank you for allowing 12-08-1998 the opportunity to participate in the care of this nice patient. Please do not hesitate to contact us for any questions or concerns.   Total time spent on today's visit was 60 minutes, including both face-to-face time and nonface-to-face time.  Time included that spent on review of records (prior notes available to me/labs/imaging if pertinent), discussing treatment and goals, answering patient's questions and coordinating care.  Cc:  [place],  MD  Marlowe Kays 06/17/2021 9:14 AM

## 2021-06-20 ENCOUNTER — Telehealth: Payer: Self-pay

## 2021-06-20 NOTE — Telephone Encounter (Signed)
-----   Message from Marcos Eke, PA-C sent at 06/17/2021  1:01 PM EDT ----- Please, inform the patient that B12 ( 923) and TSH (1.31) are normal. Thank you

## 2021-06-20 NOTE — Telephone Encounter (Signed)
Called spoke with patient and notified patient that his lab results were normal per Marlowe Kays, PA. Patient verbalized understanding.

## 2021-07-01 ENCOUNTER — Telehealth: Payer: Self-pay

## 2021-07-01 ENCOUNTER — Ambulatory Visit
Admission: RE | Admit: 2021-07-01 | Discharge: 2021-07-01 | Disposition: A | Discharge status: Home / Self Care | Payer: Managed Care, Other (non HMO) | Source: Ambulatory Visit | Attending: Physician Assistant | Admitting: Physician Assistant

## 2021-07-01 NOTE — Telephone Encounter (Signed)
-----   Message from Van Clines, MD sent at 07/01/2021  9:56 AM EDT ----- Pls let patient know brain MRI looks good, no tumor, stroke, or bleed. Thanks

## 2021-07-01 NOTE — Telephone Encounter (Signed)
Pt called and informed that brain MRI looks good, no tumor, stroke, or bleed

## 2021-08-09 DIAGNOSIS — Z006 Encounter for examination for normal comparison and control in clinical research program: Secondary | ICD-10-CM

## 2021-08-09 NOTE — Research (Signed)
Optimize Research Study  4 Year Follow-up  Patient doing well at this time, No adverse events to report and no chest pain reported at this time.  Will follow up with patient at 5 year follow-up.   Current Outpatient Medications:    aspirin EC 81 MG tablet, Take 81 mg by mouth daily., Disp: , Rfl:    clopidogrel (PLAVIX) 75 MG tablet, TAKE 1 TABLET (75 MG TOTAL) BY MOUTH DAILY WITH BREAKFAST., Disp: 90 tablet, Rfl: 0   enalapril (VASOTEC) 20 MG tablet, Take 1 tablet (20 mg total) by mouth daily., Disp: 90 tablet, Rfl: 3   ezetimibe (ZETIA) 10 MG tablet, TAKE 1 TABLET BY MOUTH DAILY (MUST SEE DOCTOR FOR FURTHER REFILLS), Disp: 90 tablet, Rfl: 3   hydrochlorothiazide (HYDRODIURIL) 25 MG tablet, TAKE 1 TABLET (25 MG TOTAL) BY MOUTH DAILY., Disp: 90 tablet, Rfl: 0   metoprolol succinate (TOPROL-XL) 100 MG 24 hr tablet, TAKE 1 TABLET (100 MG TOTAL) BY MOUTH DAILY., Disp: 90 tablet, Rfl: 0   Multiple Vitamin (MULTI-VITAMINS) TABS, Take 1 tablet by mouth daily., Disp: , Rfl:    NIFEdipine (PROCARDIA-XL/ADALAT CC) 60 MG 24 hr tablet, Take 60 mg by mouth daily., Disp: , Rfl:    potassium chloride (K-DUR) 10 MEQ tablet, TAKE 2 TABLETS BY MOUTH TWICE A DAY, Disp: 360 tablet, Rfl: 0   rosuvastatin (CRESTOR) 40 MG tablet, TAKE 1 TABLET BY MOUTH ONCE A DAY, Disp: 90 tablet, Rfl: 3

## 2021-12-29 IMAGING — MR MR HEAD W/O CM
12 series · 48 of 48 positions shown · non-contrast
Comparison: No pertinent prior exams available for comparison.

CLINICAL DATA: Memory loss. Additional history provided by scanning
technologist: Patient reports memory loss and lack of focus for 6
months.

EXAM:
MRI HEAD WITHOUT CONTRAST
TECHNIQUE: Multiplanar, multiecho pulse sequences of the brain and surrounding
structures were obtained without intravenous contrast.

[Series 5: T1 · sagittal · 4.0mm · 0.75mm/px · 3 of 31 slices shown (1 of 2)]
[im 1/31]
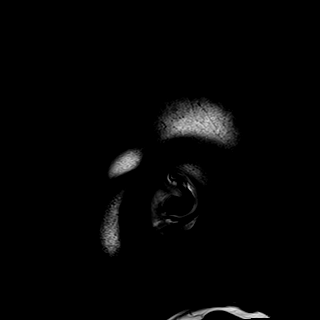
[im 16/31]
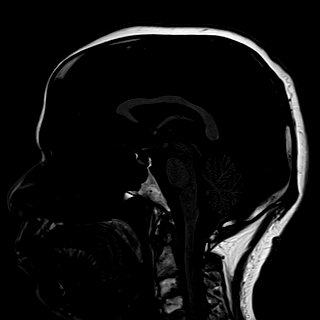
[im 31/31]
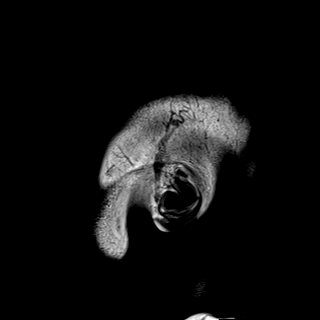

[Series 6: DWI · axial · 3.0mm · 0.94mm/px · z∈[-110,+38]mm · 9 of 168 slices shown (1 of 3)]
[im 1/168]
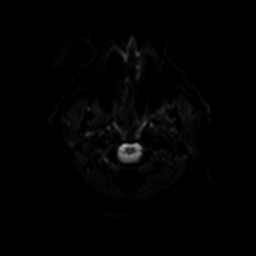
[im 21/168]
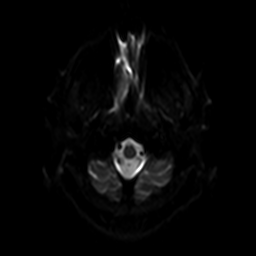
[im 42/168]
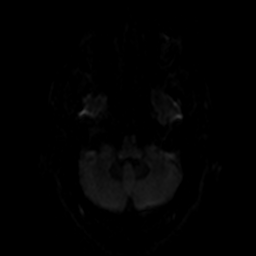
[im 63/168]
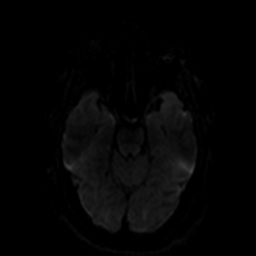
[im 84/168]
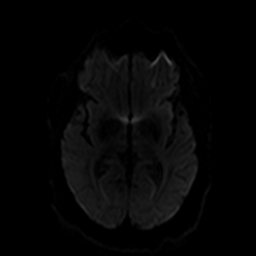
[im 105/168]
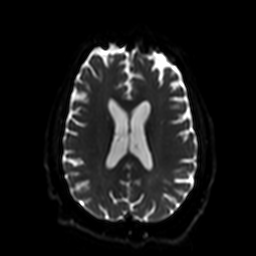
[im 126/168]
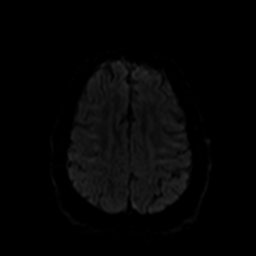
[im 147/168]
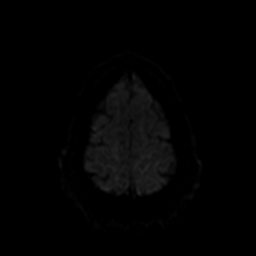
[im 168/168]
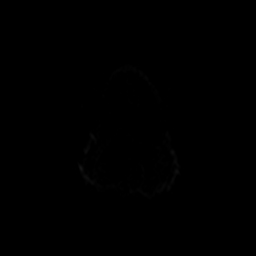

[Series 7: ax dwi_tracew · axial · 3.0mm · 0.94mm/px · z∈[-110,+38]mm · 5 of 84 slices shown]
[im 1/84]
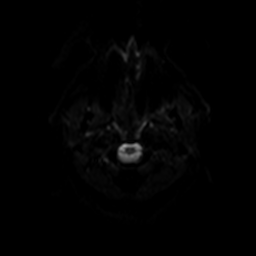
[im 21/84]
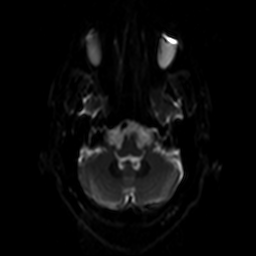
[im 42/84]
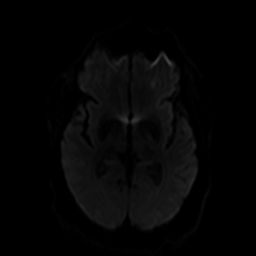
[im 63/84]
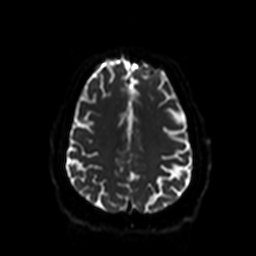
[im 84/84]
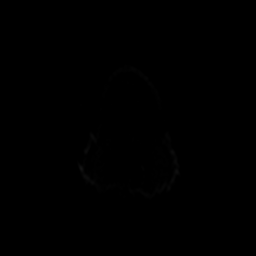

[Series 8: ax dwi_adc · axial · 3.0mm · 0.94mm/px · z∈[-110,+38]mm · 2 of 42 slices shown]
[im 1/42]
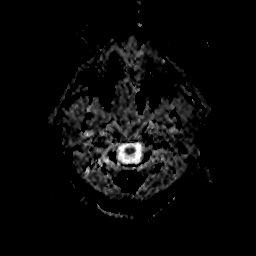
[im 42/42]
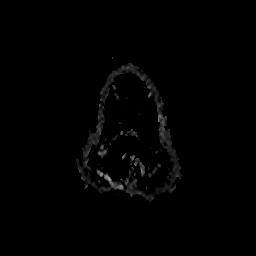

[Series 9: DWI · coronal · 5.0mm · 1.44mm/px · 3 of 62 slices shown (2 of 3)]
[im 1/62]
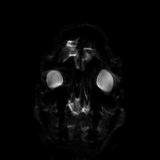
[im 31/62]
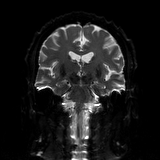
[im 62/62]
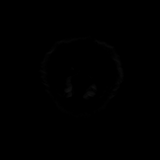

[Series 10: DWI · coronal · 5.0mm · 1.44mm/px · 2 of 31 slices shown (3 of 3)]
[im 1/31]
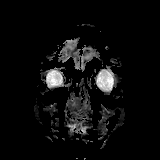
[im 31/31]
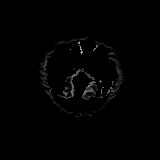

[Series 11: T2 · axial · 4.0mm · 0.36mm/px · z∈[-98,+47]mm · 2 of 29 slices shown (1 of 2)]
[im 1/29]
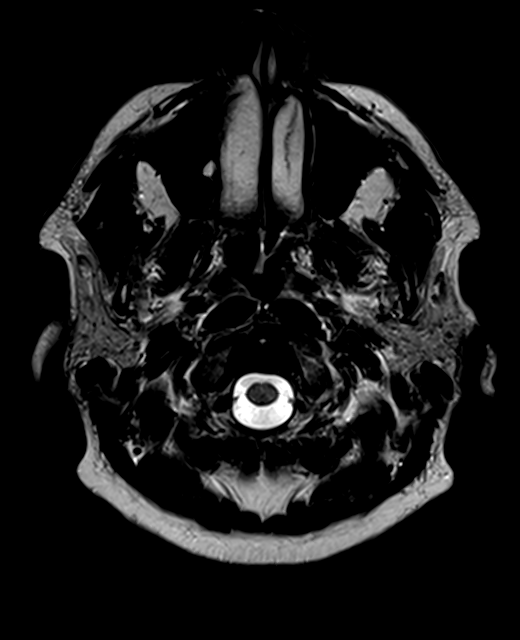
[im 29/29]
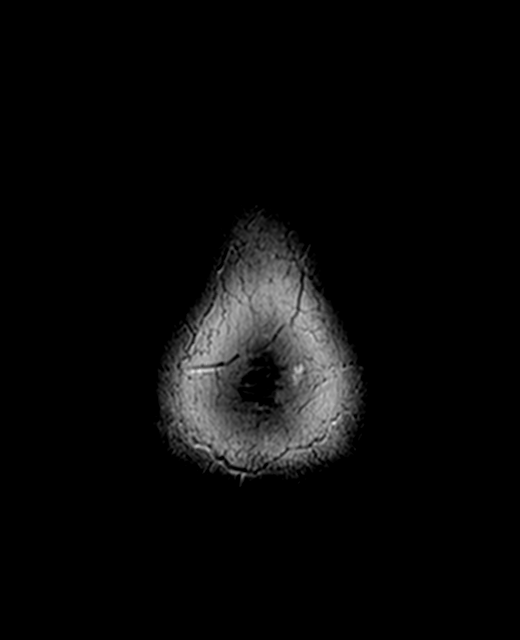

[Series 12: FLAIR · axial · 3.0mm · 0.72mm/px · 1 of 26 slices shown]
[im 1/26]
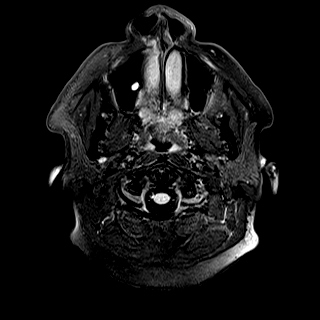

[Series 13: swi_images · axial · 1.5mm · 0.90mm/px · z∈[-97,+45]mm · 5 of 96 slices shown]
[im 1/96]
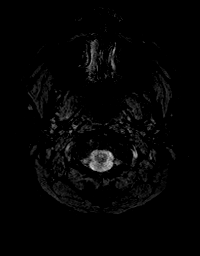
[im 24/96]
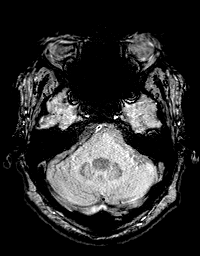
[im 48/96]
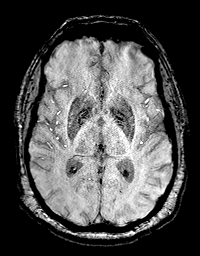
[im 72/96]
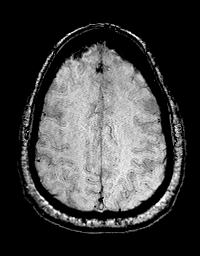
[im 96/96]
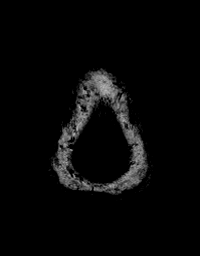

[Series 14: mip_images(sw) · axial · 12.0mm · 0.90mm/px · z∈[-91,+40]mm · 5 of 89 slices shown]
[im 1/89]
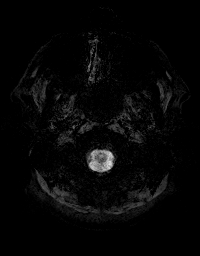
[im 23/89]
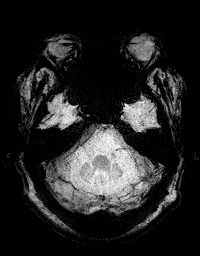
[im 45/89]
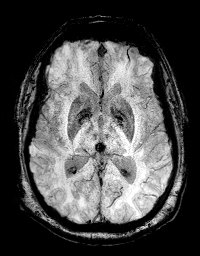
[im 67/89]
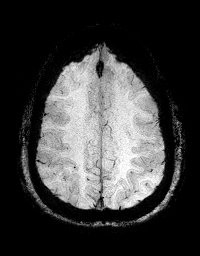
[im 89/89]
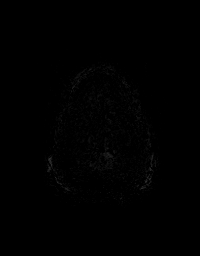

[Series 15: T1 · axial · 1.0mm · 0.94mm/px · z∈[-104,+54]mm · 9 of 160 slices shown (2 of 2)]
[im 1/160]
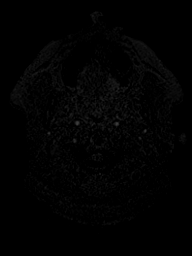
[im 20/160]
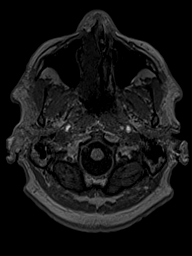
[im 40/160]
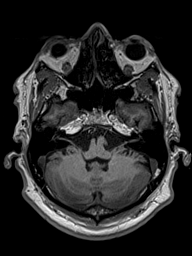
[im 60/160]
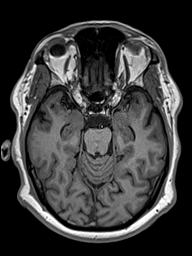
[im 80/160]
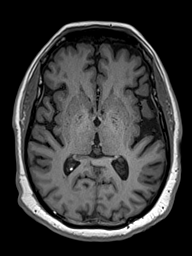
[im 100/160]
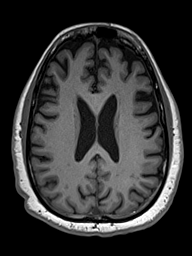
[im 120/160]
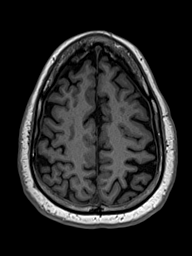
[im 140/160]
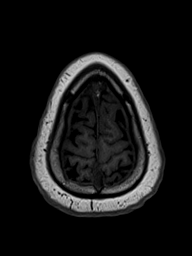
[im 160/160]
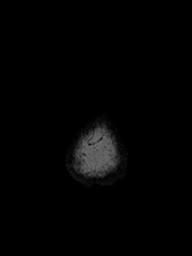

[Series 16: T2 · coronal · 4.5mm · 0.36mm/px · 2 of 33 slices shown (2 of 2)]
[im 1/33]
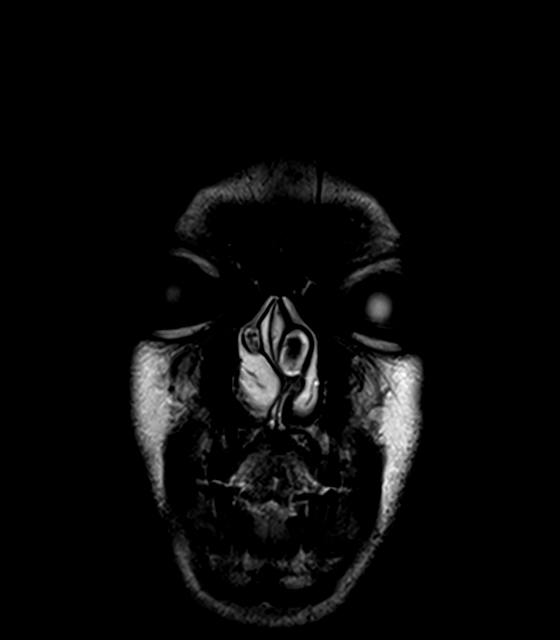
[im 33/33]
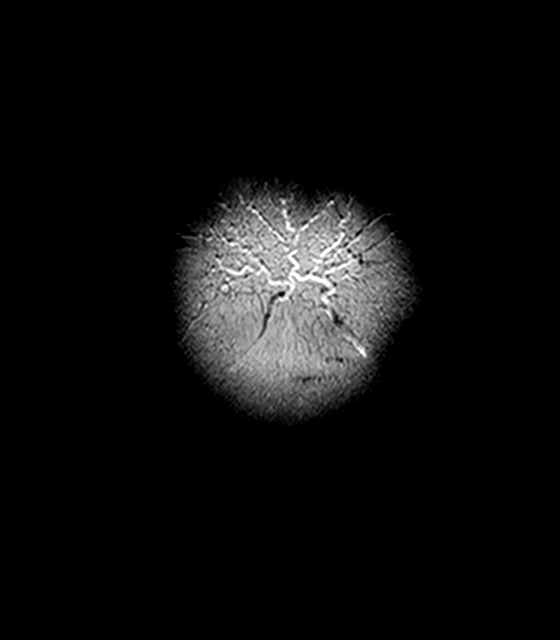

[48 of 48 positions shown; findings below may reference images not displayed]

FINDINGS: Brain:

Mild generalized cerebral and cerebellar atrophy.

Minimal multifocal T2 FLAIR hyperintense signal abnormality within
the cerebral white matter, nonspecific but compatible chronic small
vessel ischemic disease.

Punctate chronic microhemorrhage within the right parietal lobe.

There is no acute infarct.

No evidence of an intracranial mass.

No extra-axial fluid collection.

No midline shift.

Vascular: Maintained flow voids within the proximal large arterial
vessels.

Skull and upper cervical spine: No focal suspicious marrow lesion.
Incompletely assessed cervical spondylosis.

Sinuses/Orbits: Visualized orbits show no acute finding. Trace
mucosal thickening within the bilateral ethmoid air cells. Small
mucous retention cyst within the imaged right maxillary sinus.
IMPRESSION: No evidence of acute intracranial abnormality.

Minimal chronic small-vessel ischemic changes within the cerebral
white matter.

Mild generalized cerebral and cerebellar atrophy.

## 2023-01-24 DIAGNOSIS — Z0000 Encounter for general adult medical examination without abnormal findings: Secondary | ICD-10-CM | POA: Diagnosis not present

## 2023-01-24 DIAGNOSIS — Z125 Encounter for screening for malignant neoplasm of prostate: Secondary | ICD-10-CM | POA: Diagnosis not present

## 2023-01-24 DIAGNOSIS — R739 Hyperglycemia, unspecified: Secondary | ICD-10-CM | POA: Diagnosis not present

## 2023-01-31 DIAGNOSIS — S8290XD Unspecified fracture of unspecified lower leg, subsequent encounter for closed fracture with routine healing: Secondary | ICD-10-CM | POA: Diagnosis not present

## 2023-01-31 DIAGNOSIS — Z0000 Encounter for general adult medical examination without abnormal findings: Secondary | ICD-10-CM | POA: Diagnosis not present

## 2023-01-31 DIAGNOSIS — I2510 Atherosclerotic heart disease of native coronary artery without angina pectoris: Secondary | ICD-10-CM | POA: Diagnosis not present

## 2023-01-31 DIAGNOSIS — I10 Essential (primary) hypertension: Secondary | ICD-10-CM | POA: Diagnosis not present

## 2023-01-31 DIAGNOSIS — E785 Hyperlipidemia, unspecified: Secondary | ICD-10-CM | POA: Diagnosis not present

## 2023-02-01 DIAGNOSIS — H442A1 Degenerative myopia with choroidal neovascularization, right eye: Secondary | ICD-10-CM | POA: Diagnosis not present

## 2023-02-01 DIAGNOSIS — H43813 Vitreous degeneration, bilateral: Secondary | ICD-10-CM | POA: Diagnosis not present

## 2023-02-01 DIAGNOSIS — H35463 Secondary vitreoretinal degeneration, bilateral: Secondary | ICD-10-CM | POA: Diagnosis not present

## 2023-02-14 DIAGNOSIS — H442A2 Degenerative myopia with choroidal neovascularization, left eye: Secondary | ICD-10-CM | POA: Diagnosis not present

## 2023-04-11 DIAGNOSIS — H43813 Vitreous degeneration, bilateral: Secondary | ICD-10-CM | POA: Diagnosis not present

## 2023-04-11 DIAGNOSIS — H442A3 Degenerative myopia with choroidal neovascularization, bilateral eye: Secondary | ICD-10-CM | POA: Diagnosis not present

## 2023-04-11 DIAGNOSIS — H35463 Secondary vitreoretinal degeneration, bilateral: Secondary | ICD-10-CM | POA: Diagnosis not present

## 2023-05-09 DIAGNOSIS — H442A1 Degenerative myopia with choroidal neovascularization, right eye: Secondary | ICD-10-CM | POA: Diagnosis not present

## 2023-05-09 DIAGNOSIS — H442A3 Degenerative myopia with choroidal neovascularization, bilateral eye: Secondary | ICD-10-CM | POA: Diagnosis not present

## 2023-06-05 DIAGNOSIS — H43813 Vitreous degeneration, bilateral: Secondary | ICD-10-CM | POA: Diagnosis not present

## 2023-06-05 DIAGNOSIS — H3533 Angioid streaks of macula: Secondary | ICD-10-CM | POA: Diagnosis not present

## 2023-06-05 DIAGNOSIS — H401133 Primary open-angle glaucoma, bilateral, severe stage: Secondary | ICD-10-CM | POA: Diagnosis not present

## 2023-06-05 DIAGNOSIS — H2513 Age-related nuclear cataract, bilateral: Secondary | ICD-10-CM | POA: Diagnosis not present

## 2023-06-18 NOTE — Progress Notes (Unsigned)
Cardiology Office Note:  .   Date:  06/19/2023  ID:  Shaun [name], DOB [birth year], MRN [id number] PCP: Frederico Hamman, MD  [facility] Providers Cardiologist:  Chilton Si, MD    History of Present Illness: .   Shaun [name] is a 62 y.o. male with CAD s/p LAD PCI, PAD, hypertension, prediabetes, and hyperlipidemia who presents for follow up.  He was initially seen 07/2017 for an evaluation of chest pain.  He was seen in the ED 05/2017 with two days of pleuritic chest pain that improved with walking.  He also reported indigestion.  EKG showed sinus tachycardia at 123 bpm and no ischemia.  Cardiac enzymes were negative x2.   He was seen in clinic 06/2017 and referred for a coronary CT-A that revealed 40-50% distal left main disease.  FFR of the mid LAD was 0.71 and the distal left circumflex was 0.75, both concerning for ischemia.  His coronary calcium score was in the 98th percentile.   Mr. Stauffer father died of a heart attack at age 72.  After catheterization 09/06/2016 which revealed 60% mid RCA, 100% ostial L PDA, 40% ostial circumflex, 10% left main, percent D1, and 80% proximal to mid LAD.  A drug-eluting stent was placed in the LAD.  The small diagonal and LCx filled with right to left collaterals.  He saw Dr. Kirke Corin 02/2019 and given that he had no claudication medical management with yearly follow-up was recommended.    At his visit 06/2021 he was feeling well from a cardiac standpoint but noted some cognitive decline.  He was referred to neurology. Mr. Cupp notes that he recently retired.  He has been enjoying retirement since March, but admits to a sedentary lifestyle, which he plans to change by incorporating regular bike riding and walking. He recently noticed fatigue after climbing stairs, which prompted his decision to increase physical activity.  He denies any chest pain, pressure, or breathing difficulties. There is no reported swelling in the legs or feet, and no pain  during walking. He has not experienced any issues with sores on his feet.  He reports a family history of a cousin who underwent an ablation procedure for atrial fibrillation. He is otherwise well and without complaint.    ROS: as per HPI.  Studies Reviewed: Marland Kitchen   EKG Interpretation Date/Time:  Tuesday June 19 2023 08:11:26 EDT Ventricular Rate:  95 PR Interval:  176 QRS Duration:  84 QT Interval:  350 QTC Calculation: 439 R Axis:   61  Text Interpretation: Normal sinus rhythm Normal ECG When compared with ECG of 07-Aug-2017 05:53, No significant change was found Confirmed by Chilton Si ([id number]) on 06/19/2023 8:20:11 AM   Coronary CT-A 07/11/17: IMPRESSION: 1.  40-50% distal left main stenosis. 2.  Possible moderate mid LAD stenosis. 3. Coronary artery calcium score 628 Agatston units, placing the patient in the 98th percentile for age and gender. This suggests high risk for future cardiac events. FFR 0.71 in the mid LAD, suggesting a hemodynamically significant stenosis. FFR 0.75 in the distal LCx, suggesting a hemodynamically significant Stenosis.   LHC 08/06/17: Mid RCA lesion is 60% stenosed. Ost LPDA lesion is 100% stenosed. Ost Cx lesion is 40% stenosed. Ost LM to Dist LM lesion is 10% stenosed. Ost 1st Diag lesion is 100% stenosed. Prox LAD to Mid LAD lesion is 80% stenosed. A drug-eluting stent was successfully placed using a STENT PROMUS PREM MR 4.0X16. Post intervention, there is a 0% residual stenosis.  Risk  Assessment/Calculations:             Physical Exam:   VS:  BP 110/72   Pulse 95   Ht 5\' 11"  (1.803 m)   Wt 190 lb (86.2 kg)   SpO2 98%   BMI 26.50 kg/m    Wt Readings from Last 3 Encounters:  06/19/23 190 lb (86.2 kg)  06/17/21 191 lb (86.6 kg)  06/16/21 190 lb 14.4 oz (86.6 kg)    GEN: Well nourished, well developed in no acute distress NECK: No JVD; No carotid bruits CARDIAC: RRR, no murmurs, rubs, gallops RESPIRATORY:  Clear to  auscultation without rales, wheezing or rhonchi  ABDOMEN: Soft, non-tender, non-distended EXTREMITIES:  No edema; No deformity   ASSESSMENT AND PLAN: .    # Coronary Artery Disease: # Hyperlipidemia: # PAD:  Stable on dual antiplatelet therapy (Aspirin and Clopidogrel) due to the degree of blockage in the heart and legs. -Continue current regimen of Aspirin, Clopidogrel, Enalapril, Hydrochlorothiazide, Metoprolol, and Amlodipine. -Request recent cholesterol levels from primary care physician, Dr. Hyacinth Meeker, to ensure aggressive control of LDL.   # Primary Hypertension:  BP well-controlled on enalazpril, HCTZ, nifedipine and metoprolol.   No reports of cough.  # Sedentary Lifestyle Recently retired and has been less active, leading to fatigue with exertion. Plans to increase physical activity by bike riding and walking. -Encourage adherence to planned physical activity regimen, aiming for 5 days a week. -Consider follow-up to assess improvement in exercise tolerance.  Follow-up in 1 year or sooner if any issues arise.         Dispo: f/u 1 year  Signed, Chilton Si, MD

## 2023-06-19 ENCOUNTER — Encounter (HOSPITAL_BASED_OUTPATIENT_CLINIC_OR_DEPARTMENT_OTHER): Payer: Self-pay | Admitting: Cardiovascular Disease

## 2023-06-19 ENCOUNTER — Ambulatory Visit (HOSPITAL_BASED_OUTPATIENT_CLINIC_OR_DEPARTMENT_OTHER): Payer: BC Managed Care – PPO | Admitting: Cardiovascular Disease

## 2023-06-19 VITALS — BP 110/72 | HR 95 | Ht 71.0 in | Wt 190.0 lb

## 2023-06-19 DIAGNOSIS — I739 Peripheral vascular disease, unspecified: Secondary | ICD-10-CM | POA: Diagnosis not present

## 2023-06-19 DIAGNOSIS — Z9861 Coronary angioplasty status: Secondary | ICD-10-CM

## 2023-06-19 DIAGNOSIS — I2510 Atherosclerotic heart disease of native coronary artery without angina pectoris: Secondary | ICD-10-CM | POA: Diagnosis not present

## 2023-06-19 DIAGNOSIS — E7800 Pure hypercholesterolemia, unspecified: Secondary | ICD-10-CM | POA: Diagnosis not present

## 2023-06-19 DIAGNOSIS — I10 Essential (primary) hypertension: Secondary | ICD-10-CM | POA: Diagnosis not present

## 2023-06-19 NOTE — Patient Instructions (Signed)
Medication Instructions:  Your physician recommends that you continue on your current medications as directed. Please refer to the Current Medication list given to you today.  *If you need a refill on your cardiac medications before your next appointment, please call your pharmacy*  Lab Work: NONE  Testing/Procedures: NONE  Follow-Up: At [facility], you and your health needs are our priority.  As part of our continuing mission to provide you with exceptional heart care, we have created designated Provider Care Teams.  These Care Teams include your primary Cardiologist (physician) and Advanced Practice Providers (APPs -  Physician Assistants and Nurse Practitioners) who all work together to provide you with the care you need, when you need it.  We recommend signing up for the patient portal called "MyChart".  Sign up information is provided on this After Visit Summary.  MyChart is used to connect with patients for Virtual Visits (Telemedicine).  Patients are able to view lab/test results, encounter notes, upcoming appointments, etc.  Non-urgent messages can be sent to your provider as well.   To learn more about what you can do with MyChart, go to [email or web address].    Your next appointment:   12 month(s)  The format for your next appointment:   In Person  Provider:   Chilton Si, MD or Ronn Melena NP

## 2023-07-04 DIAGNOSIS — H43813 Vitreous degeneration, bilateral: Secondary | ICD-10-CM | POA: Diagnosis not present

## 2023-07-04 DIAGNOSIS — H442A3 Degenerative myopia with choroidal neovascularization, bilateral eye: Secondary | ICD-10-CM | POA: Diagnosis not present

## 2023-08-30 DIAGNOSIS — H43813 Vitreous degeneration, bilateral: Secondary | ICD-10-CM | POA: Diagnosis not present

## 2023-08-30 DIAGNOSIS — H442A3 Degenerative myopia with choroidal neovascularization, bilateral eye: Secondary | ICD-10-CM | POA: Diagnosis not present

## 2023-09-26 DIAGNOSIS — H442A2 Degenerative myopia with choroidal neovascularization, left eye: Secondary | ICD-10-CM | POA: Diagnosis not present

## 2023-09-26 DIAGNOSIS — H43813 Vitreous degeneration, bilateral: Secondary | ICD-10-CM | POA: Diagnosis not present

## 2023-10-10 DIAGNOSIS — H401133 Primary open-angle glaucoma, bilateral, severe stage: Secondary | ICD-10-CM | POA: Diagnosis not present

## 2023-10-10 DIAGNOSIS — H3533 Angioid streaks of macula: Secondary | ICD-10-CM | POA: Diagnosis not present

## 2023-10-10 DIAGNOSIS — H2513 Age-related nuclear cataract, bilateral: Secondary | ICD-10-CM | POA: Diagnosis not present

## 2023-10-10 DIAGNOSIS — H43813 Vitreous degeneration, bilateral: Secondary | ICD-10-CM | POA: Diagnosis not present

## 2024-03-11 ENCOUNTER — Telehealth: Payer: Self-pay

## 2024-03-11 NOTE — Telephone Encounter (Signed)
   Pre-operative Risk Assessment    Patient Name: Shaun [name]  DOB: 17-Jun-1961 MRN: [id number]   Date of last office visit: 06/19/23 Date of next office visit: n/a    Request for Surgical Clearance    Procedure:  Colonoscopy   Date of Surgery:  Clearance 04/11/24                                 Surgeon:  Dr. Oliva Boots  Surgeon's Group or Practice Name:  [facility]  Phone number:  [phone] Fax number:  [phone]   Type of Clearance Requested:   - Medical  - Pharmacy:  Hold Clopidogrel  (Plavix ) Hold 5 days prior to procedure    Type of Anesthesia:  propofol    Additional requests/questions:    Bonney Rebeca Blight   03/11/2024, 11:37 AM

## 2024-03-12 ENCOUNTER — Telehealth: Payer: Self-pay

## 2024-03-12 NOTE — Telephone Encounter (Signed)
   Name: Shaun [name]  DOB: Oct 14, 1960  MRN: [id number]  Primary Cardiologist: Annabella Scarce, MD   Preoperative team, please contact this patient and set up a phone call appointment for further preoperative risk assessment. Please obtain consent and complete medication review. Thank you for your help.  I confirm that guidance regarding antiplatelet and oral anticoagulation therapy has been completed and, if necessary, noted below.  Per office protocol, if patient is without any new symptoms or concerns at the time of their virtual visit, he may hold Plavix  for 5 days prior to procedure. Please resume Plavix  as soon as possible postprocedure, at the discretion of the surgeon. We recommend continuation of Aspirin  81 mg daily throughout the perioperative period.     I also confirmed the patient resides in the state of [place] . As per [facility] Medical Board telemedicine laws, the patient must reside in the state in which the provider is licensed.   Damien JAYSON Braver, NP 03/12/2024, 7:47 AM [facility]

## 2024-03-12 NOTE — Telephone Encounter (Signed)
 S/W pt and scheduled TELE Preop appt 03/17/24. Med Rec and Consent done

## 2024-03-12 NOTE — Telephone Encounter (Signed)
 Med Rec and Consent done     Patient Consent for Virtual Visit        [name] has provided verbal consent on 03/12/2024 for a virtual visit (video or telephone).   CONSENT FOR VIRTUAL VISIT FOR:  Shaun [name]  By participating in this virtual visit I agree to the following:  I hereby voluntarily request, consent and authorize [facility] and its employed or contracted physicians, physician assistants, nurse practitioners or other licensed health care professionals (the Practitioner), to provide me with telemedicine health care services (the "Services) as deemed necessary by the treating Practitioner. I acknowledge and consent to receive the Services by the Practitioner via telemedicine. I understand that the telemedicine visit will involve communicating with the Practitioner through live audiovisual communication technology and the disclosure of certain medical information by electronic transmission. I acknowledge that I have been given the opportunity to request an in-person assessment or other available alternative prior to the telemedicine visit and am voluntarily participating in the telemedicine visit.  I understand that I have the right to withhold or withdraw my consent to the use of telemedicine in the course of my care at any time, without affecting my right to future care or treatment, and that the Practitioner or I may terminate the telemedicine visit at any time. I understand that I have the right to inspect all information obtained and/or recorded in the course of the telemedicine visit and may receive copies of available information for a reasonable fee.  I understand that some of the potential risks of receiving the Services via telemedicine include:  Delay or interruption in medical evaluation due to technological equipment failure or disruption; Information transmitted may not be sufficient (e.g. poor resolution of images) to allow for appropriate medical decision  making by the Practitioner; and/or  In rare instances, security protocols could fail, causing a breach of personal health information.  Furthermore, I acknowledge that it is my responsibility to provide information about my medical history, conditions and care that is complete and accurate to the best of my ability. I acknowledge that Practitioner's advice, recommendations, and/or decision may be based on factors not within their control, such as incomplete or inaccurate data provided by me or distortions of diagnostic images or specimens that may result from electronic transmissions. I understand that the practice of medicine is not an exact science and that Practitioner makes no warranties or guarantees regarding treatment outcomes. I acknowledge that a copy of this consent can be made available to me via my patient portal [facility] MyChart), or I can request a printed copy by calling the office of [facility].    I understand that my insurance will be billed for this visit.   I have read or had this consent read to me. I understand the contents of this consent, which adequately explains the benefits and risks of the Services being provided via telemedicine.  I have been provided ample opportunity to ask questions regarding this consent and the Services and have had my questions answered to my satisfaction. I give my informed consent for the services to be provided through the use of telemedicine in my medical care

## 2024-03-17 ENCOUNTER — Ambulatory Visit: Attending: Cardiovascular Disease

## 2024-03-17 DIAGNOSIS — Z01810 Encounter for preprocedural cardiovascular examination: Secondary | ICD-10-CM | POA: Diagnosis not present

## 2024-03-17 NOTE — Progress Notes (Signed)
 Virtual Visit via Telephone Note   Because of Shaun [name] co-morbid illnesses, he is at least at moderate risk for complications without adequate follow up.  This format is felt to be most appropriate for this patient at this time.  Due to technical limitations with video connection (technology), today's appointment will be conducted as an audio only telehealth visit, and Shaun [name] verbally agreed to proceed in this manner.   All issues noted in this document were discussed and addressed.  No physical exam could be performed with this format.  Evaluation Performed:  Preoperative cardiovascular risk assessment _____________   Date:  03/17/2024   Patient ID:  Shaun [name], DOB [birth year], MRN [id number] Patient Location:  Home Provider location:   Office  Primary Care Provider:  Cleotilde Bernett BIRCH, MD Primary Cardiologist:  Shaun Scarce, MD  Chief Complaint / Patient Profile  63 y.o. y/o male with a h/o CAD s/p LAD PCI, PAD, hypertension, prediabetes and hyperlipidemia who is pending colonoscopy and presents today for telephonic preoperative cardiovascular risk assessment. History of Present Illness  Shaun [name] is a 63 y.o. male who presents via audio/video conferencing for a telehealth visit today.  Pt was last seen in cardiology clinic on 06/19/2023 by Dr. Scarce.  At that time Shaun [name] was doing well.  The patient is now pending procedure as outlined above. Since his last visit, he has remained stable from a cardiac standpoint. Today he denies chest pain, shortness of breath, lower extremity edema, fatigue, palpitations, melena, hematuria, hemoptysis, diaphoresis, weakness, presyncope, syncope, orthopnea, and PND.  He is able to achieve greater than 4 METS of activity with household chores and yard work.  Patient reports that he has a garden that he also works in daily. Past Medical History    Past Medical History:  Diagnosis Date   Atypical chest pain 06/05/2017    CAD in native artery 07/19/2017   12/18 PCI/DESx1 to mLAD, residual disease in LM, with CTO of dLcx   Essential hypertension 06/05/2017   Family history of premature CAD 06/05/2017   Hyperlipidemia with target low density lipoprotein (LDL) cholesterol less than 100 mg/dL    Hypertension    PAD (peripheral artery disease) (HCC) 06/16/2021   Past Surgical History:  Procedure Laterality Date   CORONARY ANGIOPLASTY WITH STENT PLACEMENT     CORONARY STENT INTERVENTION N/A 08/06/2017   Procedure: CORONARY STENT INTERVENTION;  Surgeon: Shaun Lonni BIRCH, MD;  Location: [facility] INVASIVE CV LAB;  Service: Cardiovascular;  Laterality: N/A;   LEFT HEART CATH AND CORONARY ANGIOGRAPHY N/A 08/06/2017   Procedure: LEFT HEART CATH AND CORONARY ANGIOGRAPHY;  Surgeon: Shaun Lonni BIRCH, MD;  Location: [facility] INVASIVE CV LAB;  Service: Cardiovascular;  Laterality: N/A;   PERIPHERAL VASCULAR INTERVENTION Right    balloon   TONSILLECTOMY     Allergies Allergies  Allergen Reactions   Latex Rash   Home Medications    Prior to Admission medications   Medication Sig Start Date End Date Taking? Authorizing Provider  aspirin  EC 81 MG tablet Take 81 mg by mouth daily.    [provider]  brimonidine (ALPHAGAN) 0.2 % ophthalmic solution Place 1 drop into both eyes 2 (two) times daily.    [provider]  clopidogrel  (PLAVIX ) 75 MG tablet TAKE 1 TABLET (75 MG TOTAL) BY MOUTH DAILY WITH BREAKFAST. 08/14/18   [name] Annabella, MD  dorzolamide-timolol  (COSOPT) 2-0.5 % ophthalmic solution Place 1 drop into both eyes 2 (two) times daily.    [provider]  enalapril  (VASOTEC ) 20 MG tablet Take 1 tablet (20 mg total) by mouth daily. 11/13/17   Shaun Riggs, MD  ezetimibe  (ZETIA ) 10 MG tablet TAKE 1 TABLET BY MOUTH DAILY (MUST SEE DOCTOR FOR FURTHER REFILLS) 08/31/20   Shaun Riggs, MD  hydrochlorothiazide  (HYDRODIURIL ) 25 MG tablet TAKE 1 TABLET (25 MG TOTAL) BY MOUTH DAILY.  08/14/18   Shaun Riggs, MD  metoprolol  succinate (TOPROL -XL) 100 MG 24 hr tablet TAKE 1 TABLET (100 MG TOTAL) BY MOUTH DAILY. 08/14/18   Shaun Riggs, MD  Multiple Vitamin (MULTI-VITAMINS) TABS Take 1 tablet by mouth daily.    [provider]  Netarsudil-Latanoprost (ROCKLATAN) 0.02-0.005 % SOLN Apply to eye. 1 drop both eyes daily    [provider]  NIFEdipine  (PROCARDIA -XL/ADALAT  CC) 60 MG 24 hr tablet Take 60 mg by mouth daily.    [provider]  potassium chloride  (K-DUR) 10 MEQ tablet TAKE 2 TABLETS BY MOUTH TWICE A DAY 08/14/18   Shaun Riggs, MD  rosuvastatin  (CRESTOR ) 40 MG tablet TAKE 1 TABLET BY MOUTH ONCE A DAY 04/09/18   Shaun Riggs, MD    Physical Exam  Vital Signs:  Shaun [name] does not have vital signs available for review today. Given telephonic nature of communication, physical exam is limited. AAOx3. NAD. Normal affect.  Speech and respirations are unlabored. Accessory Clinical Findings  None Assessment & Plan    1.  Preoperative Cardiovascular Risk Assessment: Mr. Favorite's perioperative risk of a major cardiac event is 0.9% according to the Revised Cardiac Risk Index (RCRI).  Therefore, he is at low risk for perioperative complications.   His functional capacity is good at 6.61 METs according to the Duke Activity Status Index (DASI). Recommendations: According to ACC/AHA guidelines, no further cardiovascular testing needed.  The patient may proceed to surgery at acceptable risk.   Antiplatelet and/or Anticoagulation Recommendations: Patient may hold Plavix  for 5 days prior to procedure. Please resume Plavix  as soon as possible postprocedure, at the discretion of the surgeon. We recommend continuation of Aspirin  81 mg daily throughout the perioperative period.     The patient was advised that if he develops new symptoms prior to surgery to contact our office to arrange for a follow-up visit, and he verbalized  understanding.  A copy of this note will be routed to requesting surgeon.  Time:   Today, I have spent 10 minutes with the patient with telehealth technology discussing medical history, symptoms, and management plan.    Shaun [name] Shaun Derric Dealmeida, NP  03/17/2024, 9:30 AM
